# Patient Record
Sex: Female | Born: 1935 | Race: White | Hispanic: No | Smoking: Never smoker
Health system: Southern US, Community
[De-identification: ages and names within clinical notes are randomized; demographics above are authoritative.]

## PROBLEM LIST (undated history)

## (undated) DIAGNOSIS — K219 Gastro-esophageal reflux disease without esophagitis: Secondary | ICD-10-CM

## (undated) DIAGNOSIS — D72829 Elevated white blood cell count, unspecified: Secondary | ICD-10-CM

## (undated) DIAGNOSIS — I4891 Unspecified atrial fibrillation: Secondary | ICD-10-CM

## (undated) DIAGNOSIS — M48062 Spinal stenosis, lumbar region with neurogenic claudication: Secondary | ICD-10-CM

## (undated) DIAGNOSIS — I10 Essential (primary) hypertension: Secondary | ICD-10-CM

## (undated) DIAGNOSIS — E785 Hyperlipidemia, unspecified: Secondary | ICD-10-CM

## (undated) DIAGNOSIS — M810 Age-related osteoporosis without current pathological fracture: Secondary | ICD-10-CM

## (undated) DIAGNOSIS — D751 Secondary polycythemia: Secondary | ICD-10-CM

## (undated) DIAGNOSIS — I2510 Atherosclerotic heart disease of native coronary artery without angina pectoris: Secondary | ICD-10-CM

## (undated) HISTORY — DX: Secondary polycythemia: D75.1

## (undated) HISTORY — DX: Gastro-esophageal reflux disease without esophagitis: K21.9

## (undated) HISTORY — DX: Essential (primary) hypertension: I10

## (undated) HISTORY — DX: Age-related osteoporosis without current pathological fracture: M81.0

## (undated) HISTORY — DX: Spinal stenosis, lumbar region with neurogenic claudication: M48.062

## (undated) HISTORY — DX: Atherosclerotic heart disease of native coronary artery without angina pectoris: I25.10

## (undated) HISTORY — DX: Elevated white blood cell count, unspecified: D72.829

## (undated) HISTORY — DX: Unspecified atrial fibrillation: I48.91

## (undated) HISTORY — DX: Hyperlipidemia, unspecified: E78.5

---

## 2005-05-27 ENCOUNTER — Emergency Department: Payer: Self-pay | Admitting: Unknown Physician Specialty

## 2009-04-15 ENCOUNTER — Ambulatory Visit: Payer: Self-pay | Admitting: Family Medicine

## 2009-04-19 ENCOUNTER — Emergency Department: Payer: Self-pay | Admitting: Emergency Medicine

## 2010-02-20 ENCOUNTER — Ambulatory Visit: Payer: Self-pay | Admitting: Pain Medicine

## 2010-03-10 ENCOUNTER — Ambulatory Visit: Payer: Self-pay | Admitting: Pain Medicine

## 2010-03-13 ENCOUNTER — Ambulatory Visit: Payer: Self-pay | Admitting: Pain Medicine

## 2010-03-25 ENCOUNTER — Ambulatory Visit: Payer: Self-pay | Admitting: Pain Medicine

## 2010-04-14 ENCOUNTER — Ambulatory Visit: Payer: Self-pay | Admitting: Pain Medicine

## 2010-07-17 ENCOUNTER — Ambulatory Visit: Payer: Self-pay | Admitting: Pain Medicine

## 2010-08-04 ENCOUNTER — Ambulatory Visit: Payer: Self-pay | Admitting: Pain Medicine

## 2010-09-04 ENCOUNTER — Ambulatory Visit: Payer: Self-pay | Admitting: Pain Medicine

## 2010-09-11 ENCOUNTER — Ambulatory Visit: Payer: Self-pay | Admitting: Pain Medicine

## 2010-09-17 ENCOUNTER — Ambulatory Visit: Payer: Self-pay | Admitting: Pain Medicine

## 2010-11-27 ENCOUNTER — Ambulatory Visit: Payer: Self-pay | Admitting: Pain Medicine

## 2010-12-17 ENCOUNTER — Ambulatory Visit: Payer: Self-pay | Admitting: Pain Medicine

## 2010-12-31 ENCOUNTER — Ambulatory Visit: Payer: Self-pay | Admitting: Pain Medicine

## 2011-01-14 ENCOUNTER — Ambulatory Visit: Payer: Self-pay | Admitting: Pain Medicine

## 2011-04-03 ENCOUNTER — Ambulatory Visit: Payer: Self-pay | Admitting: Internal Medicine

## 2011-04-27 ENCOUNTER — Other Ambulatory Visit: Payer: Self-pay

## 2011-04-27 LAB — BASIC METABOLIC PANEL
BUN: 10 mg/dL (ref 7–18)
Co2: 25 mmol/L (ref 21–32)
Glucose: 129 mg/dL — ABNORMAL HIGH (ref 65–99)
Osmolality: 291 (ref 275–301)
Potassium: 3.5 mmol/L (ref 3.5–5.1)
Sodium: 146 mmol/L — ABNORMAL HIGH (ref 136–145)

## 2011-04-28 ENCOUNTER — Ambulatory Visit: Payer: Self-pay | Admitting: Internal Medicine

## 2011-08-05 ENCOUNTER — Ambulatory Visit: Payer: Self-pay | Admitting: Pain Medicine

## 2011-08-17 ENCOUNTER — Ambulatory Visit: Payer: Self-pay | Admitting: Internal Medicine

## 2011-08-17 LAB — PROTIME-INR: Prothrombin Time: 17.6 secs — ABNORMAL HIGH (ref 11.5–14.7)

## 2011-09-17 ENCOUNTER — Ambulatory Visit: Payer: Self-pay | Admitting: Pain Medicine

## 2011-10-06 ENCOUNTER — Ambulatory Visit: Payer: Self-pay | Admitting: Pain Medicine

## 2011-10-15 ENCOUNTER — Ambulatory Visit: Payer: Self-pay | Admitting: Pain Medicine

## 2011-11-11 ENCOUNTER — Ambulatory Visit: Payer: Self-pay | Admitting: Pain Medicine

## 2011-12-02 ENCOUNTER — Ambulatory Visit: Payer: Self-pay | Admitting: Pain Medicine

## 2011-12-09 ENCOUNTER — Ambulatory Visit: Payer: Self-pay | Admitting: Pain Medicine

## 2011-12-16 ENCOUNTER — Ambulatory Visit: Payer: Self-pay | Admitting: Pain Medicine

## 2011-12-17 ENCOUNTER — Inpatient Hospital Stay: Payer: Self-pay | Admitting: Student

## 2011-12-17 LAB — COMPREHENSIVE METABOLIC PANEL
Albumin: 3.5 g/dL (ref 3.4–5.0)
Alkaline Phosphatase: 91 U/L (ref 50–136)
BUN: 9 mg/dL (ref 7–18)
Bilirubin,Total: 0.7 mg/dL (ref 0.2–1.0)
Chloride: 109 mmol/L — ABNORMAL HIGH (ref 98–107)
Creatinine: 0.82 mg/dL (ref 0.60–1.30)
EGFR (African American): >60
EGFR (Non-African Amer.): >60
Glucose: 103 mg/dL — ABNORMAL HIGH (ref 65–99)
Osmolality: 284 (ref 275–301)
Potassium: 3.9 mmol/L (ref 3.5–5.1)
SGOT(AST): 16 U/L (ref 15–37)
SGPT (ALT): 16 U/L (ref 12–78)
Sodium: 143 mmol/L (ref 136–145)
Total Protein: 7 g/dL (ref 6.4–8.2)

## 2011-12-17 LAB — TROPONIN I: Troponin-I: <0.02 ng/mL

## 2011-12-17 LAB — CBC
HCT: 41 % (ref 35.0–47.0)
MCHC: 31.8 g/dL — ABNORMAL LOW (ref 32.0–36.0)
Platelet: 341 10*3/uL (ref 150–440)
RDW: 18.1 % — ABNORMAL HIGH (ref 11.5–14.5)
WBC: 10.5 10*3/uL (ref 3.6–11.0)

## 2011-12-17 LAB — URINALYSIS, COMPLETE
Bacteria: NONE SEEN
Bilirubin,UR: NEGATIVE
Blood: NEGATIVE
Glucose,UR: NEGATIVE mg/dL (ref 0–75)
Nitrite: NEGATIVE
Protein: NEGATIVE
RBC,UR: 1 /HPF (ref 0–5)
Squamous Epithelial: 3
WBC UR: 1 /HPF (ref 0–5)

## 2011-12-17 LAB — TSH: Thyroid Stimulating Horm: 3.01 u[IU]/mL

## 2011-12-17 LAB — PROTIME-INR
INR: 2.8
Prothrombin Time: 29.6 secs — ABNORMAL HIGH (ref 11.5–14.7)

## 2011-12-17 LAB — CK TOTAL AND CKMB (NOT AT ARMC)
CK, Total: 63 U/L (ref 21–215)
CK-MB: 1.5 ng/mL (ref 0.5–3.6)

## 2011-12-18 LAB — CBC WITH DIFFERENTIAL/PLATELET
Basophil %: 0.2 %
Eosinophil #: 0.3 10*3/uL (ref 0.0–0.7)
HCT: 37.7 % (ref 35.0–47.0)
HGB: 12.3 g/dL (ref 12.0–16.0)
Lymphocyte #: 1.2 10*3/uL (ref 1.0–3.6)
Lymphocyte %: 12.8 %
MCH: 27.9 pg (ref 26.0–34.0)
MCHC: 32.7 g/dL (ref 32.0–36.0)
MCV: 86 fL (ref 80–100)
Monocyte #: 0.7 x10 3/mm (ref 0.2–0.9)
Monocyte %: 7.7 %
Neutrophil #: 7.3 10*3/uL — ABNORMAL HIGH (ref 1.4–6.5)
RDW: 17.5 % — ABNORMAL HIGH (ref 11.5–14.5)
WBC: 9.6 10*3/uL (ref 3.6–11.0)

## 2011-12-18 LAB — BASIC METABOLIC PANEL
BUN: 7 mg/dL (ref 7–18)
Calcium, Total: 8.5 mg/dL (ref 8.5–10.1)
Creatinine: 0.71 mg/dL (ref 0.60–1.30)
EGFR (African American): >60
EGFR (Non-African Amer.): >60
Glucose: 101 mg/dL — ABNORMAL HIGH (ref 65–99)
Potassium: 3.4 mmol/L — ABNORMAL LOW (ref 3.5–5.1)
Sodium: 143 mmol/L (ref 136–145)

## 2011-12-18 LAB — PROTIME-INR
INR: 3.7
Prothrombin Time: 36.7 secs — ABNORMAL HIGH (ref 11.5–14.7)

## 2011-12-18 LAB — MAGNESIUM: Magnesium: 1.5 mg/dL — ABNORMAL LOW

## 2011-12-19 LAB — POTASSIUM: Potassium: 3.6 mmol/L (ref 3.5–5.1)

## 2011-12-19 LAB — MAGNESIUM: Magnesium: 2.2 mg/dL

## 2011-12-19 LAB — PROTIME-INR: Prothrombin Time: 39.7 secs — ABNORMAL HIGH (ref 11.5–14.7)

## 2011-12-20 LAB — PROTIME-INR
INR: 3.5
Prothrombin Time: 35.1 secs — ABNORMAL HIGH (ref 11.5–14.7)

## 2011-12-22 LAB — CULTURE, BLOOD (SINGLE)

## 2012-02-03 DIAGNOSIS — I2510 Atherosclerotic heart disease of native coronary artery without angina pectoris: Secondary | ICD-10-CM | POA: Insufficient documentation

## 2013-09-13 DIAGNOSIS — R0602 Shortness of breath: Secondary | ICD-10-CM | POA: Insufficient documentation

## 2013-10-02 DIAGNOSIS — M48061 Spinal stenosis, lumbar region without neurogenic claudication: Secondary | ICD-10-CM | POA: Insufficient documentation

## 2013-10-02 DIAGNOSIS — M47816 Spondylosis without myelopathy or radiculopathy, lumbar region: Secondary | ICD-10-CM | POA: Insufficient documentation

## 2014-01-25 DIAGNOSIS — I482 Chronic atrial fibrillation, unspecified: Secondary | ICD-10-CM | POA: Insufficient documentation

## 2014-01-25 DIAGNOSIS — E782 Mixed hyperlipidemia: Secondary | ICD-10-CM | POA: Insufficient documentation

## 2014-05-23 DIAGNOSIS — I340 Nonrheumatic mitral (valve) insufficiency: Secondary | ICD-10-CM | POA: Insufficient documentation

## 2014-07-24 NOTE — Discharge Summary (Signed)
PATIENT NAME:  Dominique French, Dominique French MR#:  [id number] DATE OF BIRTH:  1926/06/26  DATE OF ADMISSION:  12/17/2011 DATE OF DISCHARGE:  12/20/2011  CONSULTANTS:  1. Dr. Juliann PulseLundquist  2. Physical therapist    PRIMARY CARE PHYSICIAN: Dr. Beckey DowningWillett    CHIEF COMPLAINT: Nausea, weakness.   DISCHARGE DIAGNOSES:  1. Nausea possibly from constipation and fentanyl patch.  2. Atrial fibrillation with rapid ventricular response.  3. Coagulopathy from elevated INR in the setting of Coumadin use.   SECONDARY DIAGNOSES:  1. Deconditioning.  2. Malignant hypertension.  3. Chronic back pain.  4. Possible restless leg syndrome.   DISCHARGE MEDICATIONS:  1. Metoprolol 50 mg 2 times a day.  2. Diltiazem extended-release 180 mg 1 tab 2 times a day.  3. Warfarin 5 mg 1 tab daily. Please resume this once you have checked your INR and you have been instructed by your primary care physician to resume Coumadin.  4. Zofran 4 mg 1 tab every eight hours as needed for nausea.  5. Dulcolax 50 mg/8.6 mg oral tab 2 tabs once a day at bedtime.   DIET: Low sodium.   ACTIVITY: As tolerated.   FOLLOW-UP: Please follow-up with her primary care physician within 1 to 2 days and check your INR on Tuesday.   DISPOSITION: Home with home health.  CODE STATUS: The patient is FULL CODE.   HISTORY OF PRESENT ILLNESS: For full details of the history and physical, please see the dictation on 12/17/2011 by Dr. Luberta MutterKonidena. Briefly, this is an 81 year old female with chronic pain who recently had her fentanyl patch increased from 12 mcg to 50 mcg for increased pain who presented with nausea and weakness. The patient was noted to have atrial fibrillation with RVR on arrival with rates of up to 130's to 140's and received some IV diltiazem and admitted to the hospitalist service for further evaluation and management.   SIGNIFICANT LABS AND IMAGING: Creatinine on arrival 0.82, sodium 143, potassium 3.9. Lowest magnesium was 1.2. LFTs within  normal limits. Troponin negative. TSH 3.01. WBC 10.5, hemoglobin 13.1, hematocrit 41, platelets 341. Initial INR 2.8. Peak INR 4.1. Last INR 3.5. Blood cultures no growth to date x2 from September 12th. Urinalysis not suggestive of infection.   CT head without contrast showing no acute intracranial process.   CT chest for PE with contrast showing no pulmonary embolus. Indeterminate 7 mm nodule in the right middle lobe which is indeterminate. Gallbladder is distended but incompletely visualized.   X-ray of the chest, one view, on arrival showing mild right perihilar infiltrate.   Abdomen complete three or more view showing unremarkable abdominal radiograph.   Ultrasound of abdomen showing a distended gallbladder 9.5 x 5.9 x 6.2 cm with stones and sludge. No gallbladder wall thickening or pericholecystic fluid or positive sonographic Murphy's sign. Intrahepatic biliary tree and common bile duct do not appear dilated.   HIDA scan negative.   HOSPITAL COURSE: The patient was admitted to the hospitalist service.  1. In regards to atrial fibrillation with RVR, she did receive some IV diltiazem on arrival and her beta-blocker and calcium channel blocker were resumed with good effect. Her initial INR was in the 2's, however, did go up to 4.1 on 09/14. Coumadin has been held. She can resume this once she checks her INR and she was instructed to check her INR on Tuesday and resume Coumadin after instructed to do so by her primary care physician when INR is between 2 and 3. She had  no palpitations or chest pains.  2. Her nausea was found to be likely secondary to fentanyl patch and constipation. The patient recently had the fentanyl patch increased from 12 mcg to 50 mcg patch and her symptoms coincided with that increase in the dosage. She underwent ultrasound, CAT scan, and HIDA scan. She was also seen by Surgery. No surgical intervention was recommended and, per Surgery, the nausea was likely secondary to  constipation. The patient was started on a bowel regimen, given suppositories and enema without effect and did have a bowel movement x3 with magnesium citrate with improvement in her nausea.  3. The patient initially was also thought to have pneumonia on the right side, however, the CAT scan of the chest is negative for pneumonia and the Levaquin which was started was discontinued.   DISPOSITION: At this point she will be going home with home health with PT.    ____________________________ Krystal Eaton, MD sa:drc D: 12/20/2011 14:42:57 ET T: 12/21/2011 09:26:27 ET JOB#: [id number]  cc: Krystal Eaton, MD, <Dictator> Jorje Guild. Beckey Downing, MD Krystal Eaton MD ELECTRONICALLY SIGNED 12/25/2011 14:22

## 2014-07-24 NOTE — H&P (Signed)
PATIENT NAME:  Dominique French, Dominique French MR#:  [id number] DATE OF BIRTH:  Aug 22, 1926  DATE OF ADMISSION:  12/17/2011  PRIMARY CARE PHYSICIAN: Sherrine Maples R. Beckey Downing, MD   REFERRING PHYSICIAN:   Malachy Moan, MD   CHIEF COMPLAINT: Nausea, weakness.    HISTORY OF PRESENT ILLNESS: The patient is an 81 year old female with history of hypertension, chronic atrial fibrillation, also has history of chronic back pain, was taking fentanyl patches. She was on Duragesic patch 12 mcg for the last few months, but the dose was increased to 50 mcg a week ago by Dr. Laban Emperor. The patient took the dose for about one week, and after that she stopped; and she felt very nauseous with decreased appetite, and she could not take it anymore and she came to ER today. The patient complains of generalized weakness with nausea and decreased appetite since she started to take those high-dose Duragesic patches 50 mcg. In the ER, she was evaluated and found to have pneumonia, atrial fibrillation with rapid ventricular response and uncontrolled hypertension. The patient denies chest pain, no cough, no fever, no  trouble breathing. The patient had no diarrhea. In the Emergency Room, she had atrial fibrillation with rapid ventricular response up to a rate of 130 to 140 beats. She had Cardizem 20 IV push.   PAST MEDICAL HISTORY: Significant for: 1. Chronic atrial fibrillation on Coumadin.  2. Had history of cardioversions before with Dr. Gwen Pounds.  3. History of chronic pain. The patient sees the [facility], and she has got epidural injections and she has been on fentanyl patch for the last three months, which is 12 mcg, and she was just adjusted to increased to 50 mcg a week ago.   ALLERGIES: Tramadol and Xarelto.    SOCIAL HISTORY: No smoking, no drinking, no drugs.   PAST SURGICAL HISTORY: Significant for back surgery.   SOCIAL HISTORY: No smoking, no drinking, no drugs. She lives alone.  FAMILY HISTORY: Significant for hypertension in  the family.   MEDICATIONS:  1. Diltiazem 180 mg p.o. b.i.d.  2. Metoprolol 50 mg p.o. b.i.d.  3. Coumadin 5 mg daily.    REVIEW OF SYSTEMS: CONSTITUTIONAL: Feels fatigue and weakness. EYES: No blurred vision. ENT: No tinnitus. No epistaxis. No difficulty swallowing. RESPIRATORY: No cough. No wheezing. CARDIOVASCULAR: No chest pain. No orthopnea. No PND. GI: Has nausea and poor appetite, but no abdominal pain or diarrhea. GENITOURINARY: No dysuria. ENDOCRINE: No polyuria or nocturia. HEMATOLOGIC: No anemia. INTEGUMENTARY: No skin rashes. MUSCULOSKELETAL: The patient has back pain and is taking Duragesic patch for that. NEUROLOGIC: No numbness or weakness in the legs. No dysarthria. No CVA or transient ischemic attacks. PSYCHIATRIC: No anxiety or insomnia.   PHYSICAL EXAMINATION:  VITAL SIGNS: Temperature 97.7, pulse was 100, respirations 18, blood pressure initially 140/96,    HEENT: Head: Atraumatic, normocephalic. Pupils are equal, round and reactive to light and accommodation. Extraocular movements are intact. ENT: No tympanic membrane congestion. No turbinate hypertrophy. No oropharyngeal erythema.   NECK: Normal range of motion. No JVD. No carotid bruit.   CARDIOVASCULAR: S1, S2, irregularly irregular rate, and PMI not displaced.   LUNGS: Clear to auscultation. No wheeze. No rales.   ABDOMEN: Soft, nontender, nondistended. Bowel sounds are present.   EXTREMITIES: Strength is five out of five in upper and lower extremities.   SKIN: No skin rashes.   LYMPH NODES: No lymphadenopathy in cervical region.   NEUROLOGIC: Cranial nerves II through XII are intact. Power 5/5 in upper and lower  extremities. Sensation intact.   PSYCHIATRIC: Oriented to time, place, person.   LABORATORY, DIAGNOSTIC AND RADIOLOGICAL DATA: Abdominal ultrasound is done because the patient complained of nausea. It showed gallbladder sludge and stones. No gallbladder wall thickening or pericholecystic fluid. No  positive sonographic Murphy's sign. Intrahepatic biliary tree and common bile duct are not dilated. Liver is normal. Pancreas is obscured by bowel gas. Clear urine with  trace leukocyte esterase and bacteria none. A CT of the head showed no acute intracranial process. Chest x-ray showed mild liters right perihilar infiltrate. Follow-up is recommended. The patient's CK total is 63, CPK-MB 1.5. Electrolytes: Sodium 143, potassium 3.9, chloride 109, bicarbonate 25, BUN 9, creatinine 0.82, glucose 103, liver functions within normal limits. The patient's INR is 2.8. Troponin less than 0.02. TSH 3.01. EKG showed atrial fibrillation, 79 beats per minute initially when she came.   ASSESSMENT AND PLAN: The patient is an 81 year old female patient with:  1. Nausea and decreased p.o. intake for the past one week: The patient probably got intolerant to high doses of Duragesic patch, and now she is off the patch since one week. She is feeling better, but we are going to admit her for atrial fibrillation with rapid ventricular response and uncontrolled hypertension probably secondary to weakness with nausea and dizziness. I did give her normal saline and also continued on telemetry. The patient has gallbladder stones with sludge and has no abdominal pain. We will see if it is causing her any problems, start her on diet, and she probably can benefit from surgical consult if she does not get better.  2. Atrial fibrillation with RVR and uncontrolled hypertension: The patient will be on Cardizem, metoprolol and continue those; and if not controlled we will go up on metoprolol dose or put her on Cardizem drip and consult Cardiology if her rate is uncontrolled.  3. Chronic atrial fibrillation on Coumadin: INR is therapeutic. Continue the Coumadin.  4. Chronic back pain: She denies any pain at this time, so we will hold off on the pain medications at this time and continue ambulation out of bed as tolerated.  5. The patient  already got blood cultures for pneumonia. She does have some right hilar infiltrate, so continue Levaquin for that and repeat chest x-ray after hydration probably tomorrow.   I discussed the plan with the daughter.     TIME SPENT:   About  60 minutes.   ____________________________ Katha HammingSnehalatha Sharryn Belding, MD sk:cbb D: 12/17/2011 17:07:34 ET T: 12/17/2011 17:55:15 ET JOB#: [id number]  cc: Katha HammingSnehalatha Tarun Patchell, MD, <Dictator> Jorje GuildGlenn R. Beckey DowningWillett, MD Katha HammingSNEHALATHA Briahna Pescador MD ELECTRONICALLY SIGNED 01/06/2012 22:33

## 2014-07-24 NOTE — Consult Note (Signed)
PATIENT NAME:  Dominique French, Dominique French MR#:  [id number] DATE OF BIRTH:  [birth year]  DATE OF CONSULTATION:  12/18/2011  REFERRING PHYSICIAN:   CONSULTING PHYSICIAN:  Cristal Deer A. Lucky Alverson, MD  INDICATION FOR CONSULTATION: Nausea and poor p.o. intake x1 week.  HISTORY OF PRESENT ILLNESS: Dominique French is a pleasant 81 year old female who has a history of atrial fibrillation and chronic back pain who recently approximately three weeks ago doubled the narcotic dose on her analgesic patch. Over the last week she said that she has had increasing nausea and inability to tolerate p.o. However, she is able to keep down water. She says that she is just too nauseated to eat. Nothing makes it better. She is unsure whether or not she has been given nausea medication here and is unsure whether or not it makes it better. She has stopped her patch approximately a week ago and she said her last bowel movement was two days ago. It was normal consistency but prior to that had not had a bowel movement for more than 1 week. No attempts have been made to make her have more frequent bowel movements. She has no abdominal pain whatsoever. No fevers, chills, night sweats, shortness of breath, chest pain, cough, dysuria, or hematuria.   PAST MEDICAL HISTORY:  1. Hypertension.  2. Atrial fibrillation, on Coumadin.  3. Chronic back pain.  HOME MEDICATIONS: Diltiazem, metoprolol, Coumadin, and until recently Duragesic patch.   DRUG ALLERGIES: Tramadol and Xarelto.  SOCIAL HISTORY: She lives alone in [place]. No tobacco, alcohol or other use.  Has 10 daughters.  FAMILY HISTORY: Potentially vague history of hypertension, although she said that her father was killed in War World II and her mother was alive until 24 but was relatively healthy prior.   REVIEW OF SYSTEMS: A total 10-point review of systems was conducted and pertinent positives and negatives are above.   PHYSICAL EXAMINATION:  VITAL SIGNS: Temperature 98, pulse  120, blood pressure 119/78, respirations 18, and 97% on room air.   GENERAL: No acute distress. Alert and oriented x3.   HEAD: Normocephalic, atraumatic.   EYES: No jaundice. No conjunctivitis.   FACE: Normal external nose. Normal external ears.   NECK: Supple, no obvious masses.   CHEST: Lungs clear to auscultation, moving air well.   ABDOMEN: Soft, nontender, and completely nondistended. No obvious surgical scars.   EXTREMITIES: Moves all extremities well. Strength five out of five all 4 extremities  LABORATORY AND RADIOLOGIC DATA: White cell count 9.6, hemoglobin and hematocrit 12.3 and 37.7, platelets 283, and neutrophils 76%.   INR is 3.7. Renal panel is significant for potassium of 3.4. Magnesium is 1.5.   Ultrasound shows distended gallbladder, stones and sludge. No thickening or pericholecystic fluid. No Murphy sign. Normal bile duct diameters.   CT of head shows no acute processes.   Chest x-ray: The lungs do have a right hilar infiltrate.   ASSESSMENT AND PLAN: Dominique French is an 81 year old female with one week of nausea and poor p.o. I think it likely that she could be constipated as per history of significant narcotic use and has not had a bowel movement (prior to two days ago) for more than a week. Would recommend plain film to evaluate stool burdon.  I would also recommend starting a significant bowel regimen so she has normal bowel movements to see if her discomfort improves. He malaise could also be related to a pneumonia. She is being treated for that. Nausea secondary to gallbladder pathology is low  on the differential, although it is distended and has stones. She is having no abdominal pain whatsoever and with palpation and usually people have nausea with gallbladder problems have at least some pain. Labs are relatively unremarkable. If the patient was found to have need for cholecystectomy, we would would have to either actively reverse her INR or at leastlet it drift  down, although I would not put her through the risks of FFP transfusion as she is not systemically ill. HIDA I believe is of low yield. We will continue to follow with you. ____________________________ Si Raiderhristopher A. Darrly Loberg, MD cal:slb D: 12/18/2011 10:20:44 ET T: 12/18/2011 11:21:54 ET JOB#: [id number]  cc: Cristal Deerhristopher A. Hillary Schwegler, MD, <Dictator> Jarvis NewcomerHRISTOPHER A Thekla Colborn MD ELECTRONICALLY SIGNED 12/18/2011 16:42

## 2014-09-14 DIAGNOSIS — I10 Essential (primary) hypertension: Secondary | ICD-10-CM | POA: Insufficient documentation

## 2014-10-18 ENCOUNTER — Inpatient Hospital Stay: Payer: Medicare Other | Attending: Internal Medicine | Admitting: Internal Medicine

## 2014-10-18 ENCOUNTER — Encounter: Payer: Self-pay | Admitting: Internal Medicine

## 2014-10-18 ENCOUNTER — Inpatient Hospital Stay: Payer: Medicare Other

## 2014-10-18 VITALS — BP 138/79 | HR 77 | Temp 98.4°F | Resp 18 | Ht 61.0 in | Wt 185.2 lb

## 2014-10-18 DIAGNOSIS — D751 Secondary polycythemia: Secondary | ICD-10-CM | POA: Insufficient documentation

## 2014-10-18 DIAGNOSIS — D72829 Elevated white blood cell count, unspecified: Secondary | ICD-10-CM

## 2014-10-18 DIAGNOSIS — Z79899 Other long term (current) drug therapy: Secondary | ICD-10-CM | POA: Insufficient documentation

## 2014-10-18 DIAGNOSIS — D72821 Monocytosis (symptomatic): Secondary | ICD-10-CM | POA: Diagnosis not present

## 2014-10-18 LAB — CBC WITH DIFFERENTIAL/PLATELET
Basophils Absolute: 0.1 10*3/uL (ref 0–0.1)
Basophils Relative: 1 %
EOS PCT: 4 %
Eosinophils Absolute: 0.5 10*3/uL (ref 0–0.7)
HEMATOCRIT: 45.6 % (ref 35.0–47.0)
Hemoglobin: 15 g/dL (ref 12.0–16.0)
LYMPHS PCT: 19 %
Lymphs Abs: 2.7 10*3/uL (ref 1.0–3.6)
MCH: 30.2 pg (ref 26.0–34.0)
MCHC: 32.8 g/dL (ref 32.0–36.0)
MCV: 92.1 fL (ref 80.0–100.0)
Monocytes Absolute: 1 10*3/uL — ABNORMAL HIGH (ref 0.2–0.9)
Monocytes Relative: 7 %
NEUTROS ABS: 10.4 10*3/uL — AB (ref 1.4–6.5)
NEUTROS PCT: 71 %
Platelets: 334 10*3/uL (ref 150–440)
RBC: 4.95 MIL/uL (ref 3.80–5.20)
RDW: 14.7 % — AB (ref 11.5–14.5)
WBC: 14.7 10*3/uL — ABNORMAL HIGH (ref 3.6–11.0)

## 2014-10-18 LAB — LACTATE DEHYDROGENASE: LDH: 144 U/L (ref 98–192)

## 2014-10-18 NOTE — Progress Notes (Signed)
No fevers, no infections, pt has left shoulder pain when using.

## 2014-10-19 LAB — MISC LABCORP TEST (SEND OUT): Labcorp test code: 7187

## 2014-10-22 NOTE — Progress Notes (Signed)
[facility]  Telephone:([phone]) [phone] Fax:([phone]) [phone]     ID: Dominique French OB: Jun 18, 1926  MR#: [id number]  ZHY#:[id number]  No care team member to display  CHIEF COMPLAINT/DIAGNOSIS:  Persistent leukocytosis, also had erythrocytosis in December 2015. (09/28/2014 elevated WBC count of 12,500 with 69% neutrophils (ANC 8600), 21% lymphocytes (ALC 2700), mild relative monocytosis of 1200, hemoglobin 14.9, platelets 224. Prior to this WBC was 13.6 in December 2015, 9.9 in October 2011, 9.1 in July 2011. In Dec 2015 hemoglobin was above normal range at 15.6 and hematocrit was elevated at 48%).  -  patient referred here for hematology evaluation.  HISTORY OF PRESENT ILLNESS: Dominique French is a 81 year old female patient with past medical history as detailed below. Patient was recently seen by primary physician and had labs drawn on 09/28/2014 which showed elevated WBC count of 12,500 with 69% neutrophils (ANC 8600), 21% lymphocytes (ALC 2700), mild relative monocytosis of 1200, hemoglobin 14.9, platelets 224. Prior to this WBC was 13.6 in December 2015, 9.9 in October 2011, 9.1 in July 2011. Interestingly, in December 2015 hemoglobin was above normal range at 15.6 and hematocrit was elevated at 48%. Patient denies history of smoking for secondhand smoke exposure. Clinically he states that she has chronic fatigue on exertion but otherwise denies any fevers, night sweats. No chills or recurrent infections. Denies any ongoing symptoms of sinusitis, sore throat, cough, sputum, hemoptysis or chest pain. No abdominal pain, diarrhea, dysuria or hematuria. No new skin rashes. No new joint redness or swelling. Denies history of thromboembolic phenomenon in the past. Appetite is good, no unintentional weight loss   REVIEW OF SYSTEMS:   ROS CONSTITUTIONAL: As in HPI above. No chills, fever or sweats.    ENT:  No headache, dizziness or epistaxis. No ear or jaw pain. No sinus  symptoms. RESPIRATORY:   No cough.  No shortness of breath. No wheezing. No hemoptysis. CARDIAC:  No palpitations.  No retrosternal chest pain. No orthopnea, PND. GI:  No abdominal pain, nausea or vomiting. No diarrhea.   GU:  No dysuria or hematuria.  SKIN: No rashes or pruritus. HEMATOLOGIC: denies bleeding symptoms MUSCULOSKELETAL:  No new bone pains.  EXTREMITY:  No new swelling or pain.  NEURO:  No focal weakness. No numbness or tingling of extremities.  No seizures.   ENDOCRINE:  No polyuria or polydipsia.   PAST MEDICAL HISTORY: Reviewed. Past Medical History  Diagnosis Date  . A-fib   . Lumbar stenosis with neurogenic claudication   . GERD (gastroesophageal reflux disease)   . Osteoporosis   . Hyperlipemia   . CAD (coronary artery disease)     PAST SURGICAL HISTORY: Reviewed. History reviewed. No pertinent past surgical history.  FAMILY HISTORY: Reviewed. History reviewed. No pertinent family history.  SOCIAL HISTORY: Reviewed. History  Substance Use Topics  . Smoking status: Never Smoker   . Smokeless tobacco: Never Used  . Alcohol Use: No    Allergies  Allergen Reactions  . Rivaroxaban Itching    Current Outpatient Prescriptions  Medication Sig Dispense Refill  . apixaban (ELIQUIS) 5 MG TABS tablet Take 5 mg by mouth 2 (two) times daily.    . digoxin (LANOXIN) 0.125 MG tablet Take 0.125 mg by mouth daily.    Marland Kitchen diltiazem (DILACOR XR) 240 MG 24 hr capsule Take 240 mg by mouth 2 (two) times daily.    . metoprolol succinate (TOPROL-XL) 50 MG 24 hr tablet Take 50 mg by mouth 3 (three) times daily. Take with  or immediately following a meal.     No current facility-administered medications for this visit.    PHYSICAL EXAM: Filed Vitals:   10/18/14 0908  BP: 138/79  Pulse: 77  Temp: 98.4 F (36.9 C)  Resp: 18     Body mass index is 35.01 kg/(m^2).      GENERAL: Patient is alert and oriented and in no acute distress. There is no icterus. Mild  pallor. HEENT: EOMs intact. Oral exam negative for thrush or lesions. No cervical lymphadenopathy. CVS: S1S2, regular LUNGS: Bilaterally clear to auscultation, no rhonchi. ABDOMEN: Soft, nontender. No hepatosplenomegaly clinically.  NEURO: grossly nonfocal, cranial nerves are intact.   EXTREMITIES: No pedal edema. SKIN: No major bruising or rash MUSCULOSKELETAL: No obvious joint redness or swelling LYMPHATICS: No palpable adenopathy in axillary or inguinal areas.   LAB RESULTS:    Component Value Date/Time   NA 143 12/18/2011 0405   K 3.6 12/19/2011 0530   CL 110* 12/18/2011 0405   CO2 25 12/18/2011 0405   GLUCOSE 101* 12/18/2011 0405   BUN 7 12/18/2011 0405   CREATININE 0.71 12/18/2011 0405   CALCIUM 8.5 12/18/2011 0405   PROT 7.0 12/17/2011 1129   ALBUMIN 3.5 12/17/2011 1129   AST 16 12/17/2011 1129   ALT 16 12/17/2011 1129   ALKPHOS 91 12/17/2011 1129   GFRNONAA >60 12/18/2011 0405   GFRAA >60 12/18/2011 0405   Lab Results  Component Value Date   WBC 14.7* 10/18/2014   NEUTROABS 10.4* 10/18/2014   HGB 15.0 10/18/2014   HCT 45.6 10/18/2014   MCV 92.1 10/18/2014   PLT 334 10/18/2014     STUDIES: No results found.   ASSESSMENT / PLAN:   Persistent leukocytosis, also had erythrocytosis in December 2015. (09/28/2014 elevated WBC count of 12,500 with 69% neutrophils (ANC 8600), 21% lymphocytes (ALC 2700), mild relative monocytosis of 1200, hemoglobin 14.9, platelets 224. Prior to this WBC was 13.6 in December 2015, 9.9 in October 2011, 9.1 in July 2011. In Dec 2015 hemoglobin was above normal range at 15.6 and hematocrit was elevated at 48%).  -  patient referred here for hematology evaluation. I have reviewed records sent but I think physician and also recent labs and previous labs are discussed with patient in detail. Have explained that there is no obvious etiology for persistent leukocytosis (no ongoing fevers/chills/symptoms of infection. She is a nonsmoker. No  history of recent steroid therapy or any acute illness). She also has had intermittent erythrocytosis of unclear etiology. She needs further workup to evaluate for myeloproliferative disorder or other hematological disorder causing leukocytosis. We will send labs for CBC with differential, LDH, leukocyte alkaline phosphatase score, BCL-ABL study to rule out chronic myeloid leukemia, ANA, serum erythropoietin, peripheral blood flow cytometry to look for monoclonal B-cell population/lymphoproliferative disorder. Will also send for [id number] mutation and Exon 12 analysis to look for evidence of polycythemia vera. Patient otherwise seems to be mostly asymptomatic from these hematological abnormalities and no immediate treatment is needed. Will see her back in 3 weeks and make further plan of management based upon results of this workup. In between visits, the patient has been advised to call or come to the ER in case of fevers, chills, bleeding, acute sickness or new symptoms. Patient agreeable to this plan.    Janese BanksSandeep Sherilynn Dieu, MD   10/22/2014 9:14 PM

## 2014-10-24 ENCOUNTER — Telehealth: Payer: Self-pay | Admitting: *Deleted

## 2014-10-24 NOTE — Telephone Encounter (Signed)
Called daughter Ms. Baker at [phone] and explained to her that the level was slightly elevated and since she is not a smoker the next thing to check is carbon monoxide in her house.  They had fire dept come out yest and they rtned today and recheck and no abnl level at all.  Explained that this gas in everywhere in the environment and since she has had her house checked that is the what we wanted to have check to be on the safe side.  I also called pt's house and the pt was asleep but her other daughter answered the phone and had already called pt's house and told them all what I had said.

## 2014-10-26 LAB — JAK2  V617F QUAL. WITH REFLEX TO EXON 12

## 2014-10-26 LAB — COMP PANEL: LEUKEMIA/LYMPHOMA

## 2014-10-26 LAB — ENA+DNA/DS+ANTICH+CENTRO+JO...
Centromere Ab Screen: <0.2 AI (ref 0.0–0.9)
RIBONUCLEIC PROTEIN: 4.1 AI — AB (ref 0.0–0.9)
SSA (Ro) (ENA) Antibody, IgG: <0.2 AI (ref 0.0–0.9)
SSB (La) (ENA) Antibody, IgG: <0.2 AI (ref 0.0–0.9)
ds DNA Ab: <1 IU/mL (ref 0–9)

## 2014-10-26 LAB — JAK2 EXON 12 MUTATION ANALYSIS

## 2014-10-26 LAB — ERYTHROPOIETIN: Erythropoietin: 22.4 m[IU]/mL — ABNORMAL HIGH (ref 2.6–18.5)

## 2014-10-26 LAB — ANA W/REFLEX IF POSITIVE: ANA: POSITIVE — AB

## 2014-10-27 LAB — BCR-ABL1, CML/ALL, PCR, QUANT

## 2014-10-30 LAB — LEUKOCYTE ALKALINE PHOSPHATASE: Leukocyte Alkaline  Phos Stain: 79 (ref 25–130)

## 2014-11-08 ENCOUNTER — Inpatient Hospital Stay: Payer: Medicare Other | Attending: Internal Medicine | Admitting: Internal Medicine

## 2014-11-08 VITALS — BP 146/82 | HR 87 | Temp 98.0°F | Wt 184.3 lb

## 2014-11-08 DIAGNOSIS — I4891 Unspecified atrial fibrillation: Secondary | ICD-10-CM | POA: Diagnosis not present

## 2014-11-08 DIAGNOSIS — D72829 Elevated white blood cell count, unspecified: Secondary | ICD-10-CM | POA: Diagnosis not present

## 2014-11-08 DIAGNOSIS — R768 Other specified abnormal immunological findings in serum: Secondary | ICD-10-CM

## 2014-11-08 DIAGNOSIS — Z79899 Other long term (current) drug therapy: Secondary | ICD-10-CM | POA: Insufficient documentation

## 2014-11-08 DIAGNOSIS — I2510 Atherosclerotic heart disease of native coronary artery without angina pectoris: Secondary | ICD-10-CM | POA: Diagnosis not present

## 2014-11-13 ENCOUNTER — Ambulatory Visit
Admission: RE | Admit: 2014-11-13 | Discharge: 2014-11-13 | Disposition: A | Discharge status: Home / Self Care | Payer: Medicare Other | Source: Ambulatory Visit | Attending: Internal Medicine | Admitting: Internal Medicine

## 2014-11-13 DIAGNOSIS — D72829 Elevated white blood cell count, unspecified: Secondary | ICD-10-CM | POA: Insufficient documentation

## 2014-11-13 DIAGNOSIS — K8020 Calculus of gallbladder without cholecystitis without obstruction: Secondary | ICD-10-CM | POA: Insufficient documentation

## 2014-11-13 DIAGNOSIS — R768 Other specified abnormal immunological findings in serum: Secondary | ICD-10-CM | POA: Diagnosis present

## 2014-11-13 DIAGNOSIS — K828 Other specified diseases of gallbladder: Secondary | ICD-10-CM | POA: Insufficient documentation

## 2014-11-13 DIAGNOSIS — N9489 Other specified conditions associated with female genital organs and menstrual cycle: Secondary | ICD-10-CM | POA: Insufficient documentation

## 2014-11-13 DIAGNOSIS — K760 Fatty (change of) liver, not elsewhere classified: Secondary | ICD-10-CM | POA: Insufficient documentation

## 2014-11-16 NOTE — Progress Notes (Signed)
[facility]  Telephone:(336) [phone] Fax:(336) [phone]     ID: Dominique French OB: 1926/10/22  MR#: [id number]  JYN#:[id number]  Patient Care Team: No Pcp Per Patient as PCP - General (General Practice)  CHIEF COMPLAINT/DIAGNOSIS:  Persistent leukocytosis, also had erythrocytosis in December 2015 - ? Reactive given positive ANA and RNP on workup, versus other etiology like myeloproliferative disorder. Also has mild elevation of serum erythropoietin and carboxyhemoglobin, nonsmoker.  10/18/14 - WBC 14.7, hemoglobin 15.0, platelets 334, neutrophils 71%, lymphocytes 19%, serum erythropoietin above normal range at 22, carboxyhemoglobin slightly elevated at 2.6%, ANA positive, RNP positive, LAP score normal at 79, [id number] mutation negative, JAK2 exon 12 mutation negative BCR-ABL study negative, peripheral blood flow cytometry unremarkable.  (Prior labs on 09/28/14 elevated WBC count of 12,500 with 69% neutrophils (ANC 8600), 21% lymphocytes (ALC 2700), mild relative monocytosis of 1200, hemoglobin 14.9, platelets 224. Prior to this WBC was 13.6 in December 2015, 9.9 in October 2011, 9.1 in July 2011. In Dec 2015 hemoglobin was above normal range at 15.6 and hematocrit was elevated at 48%).   HISTORY OF PRESENT ILLNESS: Patient returns for continued hematology follow-up, labs done on July 14 as described below. Overall states that she is doing fairly steady. She denies history of smoking or secondhand smoke exposure. Carboxyhemoglobin level was slightly elevated at 2.6%, states that she has ruled out any source of carbon monoxide exposure in her home, got a check by the fire department. She has chronic fatigue on exertion but otherwise denies any fevers, night sweats. No chills or recurrent infections. Denies any ongoing symptoms of sinusitis, sore throat, cough, sputum, hemoptysis or chest pain. No abdominal pain, diarrhea, dysuria or hematuria. No new skin rashes. No new joint redness  or swelling. Denies history of thromboembolic phenomenon in the past. Appetite is good.   REVIEW OF SYSTEMS:   ROS As in HPI above. In addition, no fever, chills or sweats. No new headaches or focal weakness.  No new mood disturbances. No earache or discharge. No sore throat or dysphagia. No abdominal pain, constipation, diarrhea, dysuria or hematuria. No new bone pain. No skin rash or bleeding symptoms. No new paresthesias in extremities.  PAST MEDICAL HISTORY: Reviewed. Past Medical History  Diagnosis Date  . A-fib   . Lumbar stenosis with neurogenic claudication   . GERD (gastroesophageal reflux disease)   . Osteoporosis   . Hyperlipemia   . CAD (coronary artery disease)     PAST SURGICAL HISTORY: Reviewed. As above  FAMILY HISTORY: Reviewed. No pertinent family history  SOCIAL HISTORY: Reviewed. Social History  Substance Use Topics  . Smoking status: Never Smoker   . Smokeless tobacco: Never Used  . Alcohol Use: No    Allergies  Allergen Reactions  . Rivaroxaban Itching    Current Outpatient Prescriptions  Medication Sig Dispense Refill  . apixaban (ELIQUIS) 5 MG TABS tablet Take 5 mg by mouth 2 (two) times daily.    . digoxin (LANOXIN) 0.125 MG tablet Take 0.125 mg by mouth daily.    Marland Kitchen diltiazem (DILACOR XR) 240 MG 24 hr capsule Take 240 mg by mouth 2 (two) times daily.    . metoprolol succinate (TOPROL-XL) 50 MG 24 hr tablet Take 50 mg by mouth 3 (three) times daily. Take with or immediately following a meal.     No current facility-administered medications for this visit.    PHYSICAL EXAM: Filed Vitals:   11/08/14 0931  BP: 146/82  Pulse: 87  Temp: 98  F (36.7 C)     Body mass index is 34.84 kg/(m^2).      GENERAL: Patient is alert and oriented and in no acute distress. There is no icterus. HEENT: Oral exam negative for any thrush or erythema. No cervical lymphadenopathy. CVS: S1, S2 heard with regular rate and rhythm. No murmurs. LUNGS: Bilaterally  clear to auscultation. No crepitations. ABDOMEN: Soft, nontender. No hepatosplenomegaly. EXTREMITIES: No pedal edema.    LAB RESULTS:    Component Value Date/Time   NA 143 12/18/2011 0405   K 3.6 12/19/2011 0530   CL 110* 12/18/2011 0405   CO2 25 12/18/2011 0405   GLUCOSE 101* 12/18/2011 0405   BUN 7 12/18/2011 0405   CREATININE 0.71 12/18/2011 0405   CALCIUM 8.5 12/18/2011 0405   PROT 7.0 12/17/2011 1129   ALBUMIN 3.5 12/17/2011 1129   AST 16 12/17/2011 1129   ALT 16 12/17/2011 1129   ALKPHOS 91 12/17/2011 1129   BILITOT 0.7 12/17/2011 1129   GFRNONAA >60 12/18/2011 0405   GFRAA >60 12/18/2011 0405   Lab Results  Component Value Date   WBC 14.7* 10/18/2014   NEUTROABS 10.4* 10/18/2014   HGB 15.0 10/18/2014   HCT 45.6 10/18/2014   MCV 92.1 10/18/2014   PLT 334 10/18/2014     STUDIES: US Abdomen Complete  11/13/2014   CLINICAL DATA:  Elevated white blood cell count, positive ANA, elevated erythropoietin level  EXAM: ULTRASOUND ABDOMEN COMPLETE  COMPARISON:  Hepatobiliary scan of December 18, 2011 and abdominal ultrasound of December 17, 2011.  FINDINGS: Gallbladder: Again demonstrated are gallstones. A large mobile stone measures 2.3 cm in diameter. The gallbladder is mildly distended. There is no wall thickening, pericholecystic fluid, or positive sonographic Murphy's sign.  Common bile duct: Diameter: 5 mm  Liver: The hepatic echotexture is mildly increased. There is no focal mass nor ductal dilation.  IVC: No abnormality visualized.  Pancreas: Evaluation of the pancreatic head and tail was limited due to bowel gas.  Spleen: Size and appearance within normal limits.  Right Kidney: Length: 9.4 cm. The renal cortical echotexture remains lower than that of the adjacent liver. There are parapelvic cysts with the largest measuring 1.5 cm. There is no hydronephrosis.  Left Kidney: Length: 9.9 cm. The cortical echotexture is normal. There are parapelvic cysts with the largest  measuring 1.3 cm. There is no hydronephrosis.  Abdominal aorta: No aneurysm visualized. The maximal measured diameter is 2.9 cm proximally. There is mural plaque.  Other findings: There is no ascites.  IMPRESSION: 1. Mildly distended gallbladder which is a chronic finding. There are gallstones. There is no sonographic evidence of acute cholecystitis. 2. Fatty infiltrative change of the liver. Evaluation the pancreas is limited. The spleen is normal in size. 3. The kidneys are normal in contour. There are parapelvic cysts bilaterally. There is no evidence of hydronephrosis.   Electronically Signed   By: David  [place] M.D.   On: 11/13/2014 13:04     ASSESSMENT / PLAN:   Persistent leukocytosis, also had erythrocytosis in December 2015 - ? Reactive given positive ANA and RNP on workup, versus other etiology like myeloproliferative disorder. Also has mild elevation of serum erythropoietin and carboxyhemoglobin, nonsmoker. 10/18/14 - WBC 14.7, hemoglobin 15.0, platelets 334, neutrophils 71%, lymphocytes 19%, serum erythropoietin above normal range at 22, carboxyhemoglobin slightly elevated at 2.6%, ANA positive, RNP positive, LAP score normal at 79, [id number] mutation negative, JAK2 exon 12 mutation negative BCR-ABL study negative, peripheral blood flow cytometry unremarkable     -  reviewed labs done on July 14 and discussed with patient. She continues to have mild leukocytosis but no fevers or symptoms of infection. Hemoglobin is in the upper normal range and platelet count is normal. Given serologic evidence of autoimmune disease, we will refer to Rheumatology Dr.George W.Kernodle. In addition, given elevated serum erythropoietin level, will get ultrasound of the abdomen to evaluate for any liver or kidney masses. Otherwise hematology standpoint plan is to monitor CBC/differential once every 6 weeks, and see her back at 18 weeks and make further plan of management.  In between visits, the patient has been  advised to call or come to the ER in case of fevers, chills, bleeding, acute sickness or new symptoms. Patient agreeable to this plan.    Janese Banks, MD   11/16/2014 1:05 PM

## 2014-12-20 ENCOUNTER — Inpatient Hospital Stay: Payer: Medicare Other | Attending: Internal Medicine

## 2014-12-20 ENCOUNTER — Other Ambulatory Visit: Payer: Self-pay | Admitting: *Deleted

## 2014-12-20 DIAGNOSIS — D751 Secondary polycythemia: Secondary | ICD-10-CM

## 2014-12-20 DIAGNOSIS — D72829 Elevated white blood cell count, unspecified: Secondary | ICD-10-CM | POA: Diagnosis present

## 2014-12-20 DIAGNOSIS — R768 Other specified abnormal immunological findings in serum: Secondary | ICD-10-CM

## 2014-12-20 LAB — CBC WITH DIFFERENTIAL/PLATELET
BASOS ABS: 0.1 10*3/uL (ref 0–0.1)
Basophils Relative: 0 %
Eosinophils Absolute: 0.5 10*3/uL (ref 0–0.7)
Eosinophils Relative: 4 %
HEMATOCRIT: 43.8 % (ref 35.0–47.0)
Hemoglobin: 14.4 g/dL (ref 12.0–16.0)
LYMPHS PCT: 20 %
Lymphs Abs: 2.5 10*3/uL (ref 1.0–3.6)
MCH: 30 pg (ref 26.0–34.0)
MCHC: 32.8 g/dL (ref 32.0–36.0)
MCV: 91.3 fL (ref 80.0–100.0)
MONO ABS: 1 10*3/uL — AB (ref 0.2–0.9)
Monocytes Relative: 7 %
NEUTROS ABS: 8.9 10*3/uL — AB (ref 1.4–6.5)
Neutrophils Relative %: 69 %
Platelets: 308 10*3/uL (ref 150–440)
RBC: 4.8 MIL/uL (ref 3.80–5.20)
RDW: 14.4 % (ref 11.5–14.5)
WBC: 12.9 10*3/uL — ABNORMAL HIGH (ref 3.6–11.0)

## 2014-12-21 LAB — MISC LABCORP TEST (SEND OUT): Labcorp test code: 7187

## 2015-01-15 DIAGNOSIS — M79603 Pain in arm, unspecified: Secondary | ICD-10-CM | POA: Insufficient documentation

## 2015-01-31 ENCOUNTER — Inpatient Hospital Stay: Payer: Medicare Other | Attending: Family Medicine

## 2015-01-31 DIAGNOSIS — D72829 Elevated white blood cell count, unspecified: Secondary | ICD-10-CM | POA: Insufficient documentation

## 2015-01-31 DIAGNOSIS — R768 Other specified abnormal immunological findings in serum: Secondary | ICD-10-CM

## 2015-01-31 LAB — CBC WITH DIFFERENTIAL/PLATELET
BASOS ABS: 0 10*3/uL (ref 0–0.1)
BASOS PCT: 0 %
EOS ABS: 0.5 10*3/uL (ref 0–0.7)
Eosinophils Relative: 4 %
HEMATOCRIT: 45.8 % (ref 35.0–47.0)
HEMOGLOBIN: 15 g/dL (ref 12.0–16.0)
Lymphocytes Relative: 19 %
Lymphs Abs: 2.5 10*3/uL (ref 1.0–3.6)
MCH: 29.4 pg (ref 26.0–34.0)
MCHC: 32.8 g/dL (ref 32.0–36.0)
MCV: 89.6 fL (ref 80.0–100.0)
Monocytes Absolute: 0.9 10*3/uL (ref 0.2–0.9)
Monocytes Relative: 7 %
NEUTROS ABS: 9.2 10*3/uL — AB (ref 1.4–6.5)
NEUTROS PCT: 70 %
Platelets: 319 10*3/uL (ref 150–440)
RBC: 5.11 MIL/uL (ref 3.80–5.20)
RDW: 14.3 % (ref 11.5–14.5)
WBC: 13 10*3/uL — ABNORMAL HIGH (ref 3.6–11.0)

## 2015-03-14 ENCOUNTER — Encounter: Payer: Self-pay | Admitting: Internal Medicine

## 2015-03-14 ENCOUNTER — Inpatient Hospital Stay: Payer: Medicare Other | Attending: Family Medicine

## 2015-03-14 ENCOUNTER — Other Ambulatory Visit: Payer: Self-pay | Admitting: *Deleted

## 2015-03-14 ENCOUNTER — Inpatient Hospital Stay (HOSPITAL_BASED_OUTPATIENT_CLINIC_OR_DEPARTMENT_OTHER): Payer: Medicare Other | Admitting: Internal Medicine

## 2015-03-14 VITALS — BP 115/78 | HR 82 | Temp 98.7°F | Resp 18 | Ht 61.0 in | Wt 187.4 lb

## 2015-03-14 DIAGNOSIS — K219 Gastro-esophageal reflux disease without esophagitis: Secondary | ICD-10-CM | POA: Insufficient documentation

## 2015-03-14 DIAGNOSIS — Z7901 Long term (current) use of anticoagulants: Secondary | ICD-10-CM | POA: Insufficient documentation

## 2015-03-14 DIAGNOSIS — I2510 Atherosclerotic heart disease of native coronary artery without angina pectoris: Secondary | ICD-10-CM | POA: Diagnosis not present

## 2015-03-14 DIAGNOSIS — D72829 Elevated white blood cell count, unspecified: Secondary | ICD-10-CM

## 2015-03-14 DIAGNOSIS — Z79899 Other long term (current) drug therapy: Secondary | ICD-10-CM

## 2015-03-14 DIAGNOSIS — E785 Hyperlipidemia, unspecified: Secondary | ICD-10-CM | POA: Insufficient documentation

## 2015-03-14 DIAGNOSIS — I4891 Unspecified atrial fibrillation: Secondary | ICD-10-CM | POA: Diagnosis not present

## 2015-03-14 DIAGNOSIS — D751 Secondary polycythemia: Secondary | ICD-10-CM

## 2015-03-14 LAB — CBC WITH DIFFERENTIAL/PLATELET
Basophils Absolute: 0.1 10*3/uL (ref 0–0.1)
Basophils Relative: 1 %
EOS PCT: 5 %
Eosinophils Absolute: 0.6 10*3/uL (ref 0–0.7)
HEMATOCRIT: 43.4 % (ref 35.0–47.0)
Hemoglobin: 14.2 g/dL (ref 12.0–16.0)
LYMPHS ABS: 2.7 10*3/uL (ref 1.0–3.6)
LYMPHS PCT: 21 %
MCH: 28.9 pg (ref 26.0–34.0)
MCHC: 32.6 g/dL (ref 32.0–36.0)
MCV: 88.7 fL (ref 80.0–100.0)
MONO ABS: 0.9 10*3/uL (ref 0.2–0.9)
Monocytes Relative: 7 %
Neutro Abs: 8.6 10*3/uL — ABNORMAL HIGH (ref 1.4–6.5)
Neutrophils Relative %: 66 %
PLATELETS: 305 10*3/uL (ref 150–440)
RBC: 4.9 MIL/uL (ref 3.80–5.20)
RDW: 14.9 % — AB (ref 11.5–14.5)
WBC: 12.8 10*3/uL — ABNORMAL HIGH (ref 3.6–11.0)

## 2015-03-14 NOTE — Progress Notes (Signed)
Pt here, no hot flashes, night sweats, no fevers. No infections.

## 2015-03-14 NOTE — Progress Notes (Signed)
[facility]  Telephone:(336) [phone] Fax:(336) [phone]     ID: Luan MooreMarion J Mallicoat OB: 1926/06/06  MR#: [id number]  JYN#:[id number]#:[id number]  Patient Care Team: No Pcp Per Patient as PCP - General (General Practice)  CHIEF COMPLAINT/DIAGNOSIS:  Persistent leukocytosis, also had erythrocytosis in December 2015 - ? Reactive given positive ANA and RNP on workup, versus other etiology like myeloproliferative disorder. Also has mild elevation of serum erythropoietin and carboxyhemoglobin, nonsmoker.  10/18/14 - WBC 14.7, hemoglobin 15.0, platelets 334, neutrophils 71%, lymphocytes 19%, serum erythropoietin above normal range at 22, carboxyhemoglobin slightly elevated at 2.6%, ANA positive, RNP positive, LAP score normal at 79, [id number] mutation negative, JAK2 exon 12 mutation negative BCR-ABL study negative, peripheral blood flow cytometry unremarkable.  (Prior labs on 09/28/14 elevated WBC count of 12,500 with 69% neutrophils (ANC 8600), 21% lymphocytes (ALC 2700), mild relative monocytosis of 1200, hemoglobin 14.9, platelets 224. Prior to this WBC was 13.6 in December 2015, 9.9 in October 2011, 9.1 in July 2011. In Dec 2015 hemoglobin was above normal range at 15.6 and hematocrit was elevated at 48%).   HISTORY OF PRESENT ILLNESS: Mrs. Ty Hiltsdmonds returns to call clinic for follow-up visit. She has done reasonably well since her last appointment. She denies any particular new complaints. She is able to perform all activities of daily living with minimal difficulties. She has enjoyed her meals during Thanksgiving time and is looking forward to celebrating Christmas soon.   REVIEW OF SYSTEMS:   ROS As in HPI above. In addition, no fever, chills or sweats. No new headaches or focal weakness.  No new mood disturbances. No earache or discharge. No sore throat or dysphagia. No abdominal pain, constipation, diarrhea, dysuria or hematuria. No new bone pain. No skin rash or bleeding symptoms. No new  paresthesias in extremities.  PAST MEDICAL HISTORY: Reviewed. Past Medical History  Diagnosis Date  . A-fib   . Lumbar stenosis with neurogenic claudication   . GERD (gastroesophageal reflux disease)   . Osteoporosis   . Hyperlipemia   . CAD (coronary artery disease)     PAST SURGICAL HISTORY: Reviewed. As above  FAMILY HISTORY: Reviewed. No pertinent family history  SOCIAL HISTORY: Reviewed. Social History  Substance Use Topics  . Smoking status: Never Smoker   . Smokeless tobacco: Never Used  . Alcohol Use: No    Allergies  Allergen Reactions  . Rivaroxaban Itching    Current Outpatient Prescriptions  Medication Sig Dispense Refill  . apixaban (ELIQUIS) 5 MG TABS tablet Take 5 mg by mouth 2 (two) times daily.    . digoxin (LANOXIN) 0.125 MG tablet Take 0.125 mg by mouth daily.    Marland Kitchen. diltiazem (DILACOR XR) 240 MG 24 hr capsule Take 240 mg by mouth 2 (two) times daily.    . metoprolol succinate (TOPROL-XL) 50 MG 24 hr tablet Take 50 mg by mouth 3 (three) times daily. Take with or immediately following a meal.     No current facility-administered medications for this visit.    PHYSICAL EXAM: Filed Vitals:   03/14/15 1034  BP: 115/78  Pulse: 82  Temp: 98.7 F (37.1 C)  Resp: 18     Body mass index is 35.43 kg/(m^2).      GENERAL: Patient is alert and oriented and in no acute distress, elderly Caucasian female. There is no icterus. HEENT: Oral exam negative for any thrush or erythema. No cervical lymphadenopathy. CVS: S1, S2 heard with regular rate and rhythm. No murmurs. LUNGS: Bilaterally clear to auscultation.  No crepitations. ABDOMEN: Soft, nontender. No hepatosplenomegaly. EXTREMITIES: No pedal edema.    LAB RESULTS:    Component Value Date/Time   NA 143 12/18/2011 0405   K 3.6 12/19/2011 0530   CL 110* 12/18/2011 0405   CO2 25 12/18/2011 0405   GLUCOSE 101* 12/18/2011 0405   BUN 7 12/18/2011 0405   CREATININE 0.71 12/18/2011 0405   CALCIUM 8.5  12/18/2011 0405   PROT 7.0 12/17/2011 1129   ALBUMIN 3.5 12/17/2011 1129   AST 16 12/17/2011 1129   ALT 16 12/17/2011 1129   ALKPHOS 91 12/17/2011 1129   BILITOT 0.7 12/17/2011 1129   GFRNONAA >60 12/18/2011 0405   GFRNONAA >60 04/27/2011 1009   GFRAA >60 12/18/2011 0405   GFRAA >60 04/27/2011 1009   Lab Results  Component Value Date   WBC 13.0* 01/31/2015   NEUTROABS 9.2* 01/31/2015   HGB 15.0 01/31/2015   HCT 45.8 01/31/2015   MCV 89.6 01/31/2015   PLT 319 01/31/2015     STUDIES: No results found.   ASSESSMENT / PLAN:   Persistent leukocytosis, also had erythrocytosis in December 2015 - ? Reactive given positive ANA and RNP on workup, versus other etiology like myeloproliferative disorder. Also has mild elevation of serum erythropoietin and carboxyhemoglobin, nonsmoker. An extensive workup showed no evidence of hematologic malignancy, specifically CML or polycythemia vera. There was a question about the potential early CMML, but absolute monocyte count has returned to normal range. With a likelihood it would be useful to obtain an opinion from a rheumatologist specialist regarding the positivity for ANA and RNP, which could potentially indicate the presence of mixed connective tissue disease, but I cannot identify any symptoms or signs, which would be linked to such a diagnosis. Taking into account the fact that her hematologic parameters have been remarkably stable, and that the work up showed no evidence of hematologic malignancy, I feel that Mrs. Olenick does not require a follow-up appointment with hematology anymore. She should continue follow-up with her primary care physicians and if any issues or problems arise, which would require an opinion from a hematology specialist, she should be referred to [place]. Patient expressed understanding and agreement with this plan. No follow-up appointment is given. Referral to rheumatology is placed.  Gorden Harms, MD   03/14/2015 10:16  AM

## 2015-04-03 DIAGNOSIS — D72829 Elevated white blood cell count, unspecified: Secondary | ICD-10-CM | POA: Insufficient documentation

## 2015-04-03 DIAGNOSIS — R768 Other specified abnormal immunological findings in serum: Secondary | ICD-10-CM | POA: Insufficient documentation

## 2017-05-22 DIAGNOSIS — J111 Influenza due to unidentified influenza virus with other respiratory manifestations: Secondary | ICD-10-CM | POA: Insufficient documentation

## 2017-06-01 ENCOUNTER — Other Ambulatory Visit: Payer: Self-pay

## 2017-06-01 ENCOUNTER — Ambulatory Visit: Payer: Self-pay | Admitting: Family Medicine

## 2017-06-02 ENCOUNTER — Encounter: Payer: Self-pay | Admitting: Family Medicine

## 2017-06-02 ENCOUNTER — Ambulatory Visit (INDEPENDENT_AMBULATORY_CARE_PROVIDER_SITE_OTHER): Payer: Medicare Other | Admitting: Family Medicine

## 2017-06-02 VITALS — BP 102/68 | HR 147 | Resp 16 | Ht 61.0 in | Wt 180.0 lb

## 2017-06-02 DIAGNOSIS — R413 Other amnesia: Secondary | ICD-10-CM | POA: Diagnosis not present

## 2017-06-02 DIAGNOSIS — I482 Chronic atrial fibrillation, unspecified: Secondary | ICD-10-CM

## 2017-06-02 DIAGNOSIS — M47816 Spondylosis without myelopathy or radiculopathy, lumbar region: Secondary | ICD-10-CM | POA: Diagnosis not present

## 2017-06-02 DIAGNOSIS — E538 Deficiency of other specified B group vitamins: Secondary | ICD-10-CM | POA: Diagnosis not present

## 2017-06-02 DIAGNOSIS — R739 Hyperglycemia, unspecified: Secondary | ICD-10-CM | POA: Diagnosis not present

## 2017-06-02 DIAGNOSIS — B351 Tinea unguium: Secondary | ICD-10-CM | POA: Diagnosis not present

## 2017-06-02 DIAGNOSIS — I2510 Atherosclerotic heart disease of native coronary artery without angina pectoris: Secondary | ICD-10-CM | POA: Diagnosis not present

## 2017-06-02 DIAGNOSIS — Z23 Encounter for immunization: Secondary | ICD-10-CM | POA: Diagnosis not present

## 2017-06-02 DIAGNOSIS — Z7189 Other specified counseling: Secondary | ICD-10-CM | POA: Diagnosis not present

## 2017-06-02 NOTE — Progress Notes (Signed)
Date:  06/02/2017   Name:  Dominique French   DOB:  06/17/1926   MRN:  [id number]  PCP:  Schuyler Amor, MD    Chief Complaint: Establish Care (Doctor Letta Kocher was last PCP and retired. Recent admission to hospital 4 days D/C from [facility] 05/27/2017 ) and Pneumonia (Vomited on Abx in hospital and at home. Afib in hospital with this and had UTI. )   History of Present Illness:  This is a 84 y.o. female seen for initial visit. Recently hospitalized for afib with RVR, d/c 8d ago. Also had flu and UTI per daughter, s/p Tamiflu and doxy x 5d. Cough still productive yellow phlegm and SOB with exertion but getting stronger with home PT/OT. BLE resolved. Hx CAD/HTN, not taking meds as prescribed but family now using med box. On Eliquis x yrs, B12 low in hospital, taking supplement. Hx lumbar stenosis s/p injections in past but only bother intermittently now. Appetite poor but improving, thinks has lost weight. Memory slipping but declines cognitive testing today. Uses walker, no recent falls. TSH sl elevated last week, glucose high in past. Did not want to discuss DNR today. Father died in WWII, mother died 3 old age, sister with heart dz/OA. Never had tetanus, pneumo, or zoster imms, no colonoscopy in past, avoids doctors.  Review of Systems:  Review of Systems  Constitutional: Negative for chills and fever.  HENT: Negative for ear pain, sinus pain and trouble swallowing.   Eyes: Negative for pain.  Cardiovascular: Negative for chest pain and leg swelling.  Gastrointestinal: Negative for abdominal pain, constipation and diarrhea.  Endocrine: Negative for polydipsia and polyuria.  Genitourinary: Negative for difficulty urinating and dysuria.  Neurological: Negative for syncope and light-headedness.  Psychiatric/Behavioral: Negative for behavioral problems.    Patient Active Problem List   Diagnosis Date Noted  . B12 deficiency 06/02/2017  . Hyperglycemia 06/02/2017  . Onychomycosis  06/02/2017  . Memory loss 06/02/2017  . ANA positive 04/03/2015  . Leukocytosis 04/03/2015  . Arm pain 01/15/2015  . Benign essential hypertension 09/14/2014  . Moderate mitral insufficiency 05/23/2014  . Chronic atrial fibrillation (HCC) 01/25/2014  . Combined fat and carbohydrate induced hyperlipemia 01/25/2014  . Degenerative arthritis of lumbar spine 10/02/2013  . Lumbar canal stenosis 10/02/2013  . Breath shortness 09/13/2013  . Arteriosclerosis of coronary artery 02/03/2012  . CAD (coronary artery disease) 02/03/2012    Prior to Admission medications   Medication Sig Start Date End Date Taking? Authorizing Provider  acetaminophen (TYLENOL) 500 MG tablet Take 1,000 mg by mouth 2 (two) times daily as needed for pain.   Yes [provider]  apixaban (ELIQUIS) 5 MG TABS tablet Take 5 mg by mouth 2 (two) times daily.   Yes [provider]  cyanocobalamin 500 MCG tablet Take 500 mcg by mouth daily.   Yes [provider]  diltiazem (CARDIZEM CD) 240 MG 24 hr capsule Take 240 mg by mouth 2 (two) times daily.   Yes [provider]  metoprolol tartrate (LOPRESSOR) 50 MG tablet Take 50 mg by mouth 2 (two) times daily.   Yes [provider]  PROAIR HFA 108 (90 Base) MCG/ACT inhaler INL 2 PFS PO Q 4 H PRF WHZ OR SOB 05/25/17  Yes [provider]  Spacer/Aero-Holding Chambers (VALVED HOLDING CHAMBER) DEVI U UTD 05/26/17  Yes [provider]    Allergies  Allergen Reactions  . Rivaroxaban Itching    History reviewed. No pertinent surgical history.  Social History  Tobacco Use  . Smoking status: Never Smoker  . Smokeless tobacco: Never Used  Substance Use Topics  . Alcohol use: No  . Drug use: No    Family History  Problem Relation Age of Onset  . Stomach cancer Father     Medication list has been reviewed and updated.  Physical Examination: BP 102/68   Pulse (!) 147   Resp 16   Ht 5\' 1"  (1.549 m)   Wt 180 lb  (81.6 kg)   SpO2 99%   BMI 34.01 kg/m   Physical Exam  Constitutional: She is oriented to person, place, and time. She appears well-developed and well-nourished. No distress.  HENT:  Head: Normocephalic and atraumatic.  Right Ear: External ear normal.  Left Ear: External ear normal.  Nose: Nose normal.  Mouth/Throat: Oropharynx is clear and moist.  TMs clear  Eyes: Conjunctivae and EOM are normal. Pupils are equal, round, and reactive to light.  Neck: Neck supple. No thyromegaly present.  Cardiovascular: Normal heart sounds and intact distal pulses.  Rapid irregular rhythm  Pulmonary/Chest: Effort normal.  Diffuse exp wheezes  Abdominal: Soft. She exhibits no distension and no mass. There is no tenderness.  Musculoskeletal: She exhibits no edema.  Lymphadenopathy:    She has no cervical adenopathy.  Neurological: She is alert and oriented to person, place, and time. Coordination normal.  Romberg positive, gait unsteady  Skin: Skin is warm and dry. She is not diaphoretic.  Onychomycosis multiple toenails  Psychiatric: She has a normal mood and affect. Her behavior is normal.  Nursing note and vitals reviewed.   Assessment and Plan:  1. Chronic atrial fibrillation (HCC) With RVR, likely due to med noncompliance, family to assure takes metoprolol and diltiazem bid, cont Eliquis - Comprehensive Metabolic Panel (CMET) - CBC  2. Coronary artery disease involving native coronary artery of native heart without angina pectoris Stable on BB/Eliquis, consider statin - Lipid Profile  3. Osteoarthritis of lumbar spine, unspecified spinal osteoarthritis complication status S/p injections in past, Tylenol prn for now  4. Hyperglycemia - HgB A1c  5. Onychomycosis - Ambulatory referral to Podiatry  6. Memory loss Cognitive testing/GDS next visit  7. B12 deficiency On supplement x 1 week - B12  8. Advance care planning DNR mentioned today, wants to discuss next visit  9.  Need for pneumococcal vaccination - Pneumococcal conjugate vaccine 13-valent  10. HM Consider Tdap/Shingrix next visit  Return in about 1 week (around 06/09/2017).   One hour spent with pt/family over half in counseling  Daje Stark M. Kingsley SpittlePlonk, Jr. MD [facility]  06/02/2017

## 2017-06-03 LAB — COMPREHENSIVE METABOLIC PANEL
ALT: 15 IU/L (ref 0–32)
AST: 21 IU/L (ref 0–40)
Albumin/Globulin Ratio: 1.8 (ref 1.2–2.2)
Albumin: 4.1 g/dL (ref 3.5–4.7)
Alkaline Phosphatase: 89 IU/L (ref 39–117)
BILIRUBIN TOTAL: 1.2 mg/dL (ref 0.0–1.2)
BUN/Creatinine Ratio: 10 — ABNORMAL LOW (ref 12–28)
BUN: 10 mg/dL (ref 8–27)
CALCIUM: 9.1 mg/dL (ref 8.7–10.3)
CHLORIDE: 104 mmol/L (ref 96–106)
CO2: 23 mmol/L (ref 20–29)
Creatinine, Ser: 1.05 mg/dL — ABNORMAL HIGH (ref 0.57–1.00)
GFR, EST AFRICAN AMERICAN: 55 mL/min/{1.73_m2} — AB (ref >59)
GFR, EST NON AFRICAN AMERICAN: 48 mL/min/{1.73_m2} — AB (ref >59)
GLUCOSE: 129 mg/dL — AB (ref 65–99)
Globulin, Total: 2.3 g/dL (ref 1.5–4.5)
Potassium: 4 mmol/L (ref 3.5–5.2)
Sodium: 146 mmol/L — ABNORMAL HIGH (ref 134–144)
Total Protein: 6.4 g/dL (ref 6.0–8.5)

## 2017-06-03 LAB — LIPID PANEL
Chol/HDL Ratio: 3.1 ratio (ref 0.0–4.4)
Cholesterol, Total: 150 mg/dL (ref 100–199)
HDL: 49 mg/dL (ref >39)
LDL CALC: 71 mg/dL (ref 0–99)
Triglycerides: 148 mg/dL (ref 0–149)
VLDL CHOLESTEROL CAL: 30 mg/dL (ref 5–40)

## 2017-06-03 LAB — CBC
HEMATOCRIT: 44.6 % (ref 34.0–46.6)
HEMOGLOBIN: 15.6 g/dL (ref 11.1–15.9)
MCH: 34.7 pg — ABNORMAL HIGH (ref 26.6–33.0)
MCHC: 35 g/dL (ref 31.5–35.7)
MCV: 99 fL — ABNORMAL HIGH (ref 79–97)
Platelets: 349 10*3/uL (ref 150–379)
RBC: 4.5 x10E6/uL (ref 3.77–5.28)
RDW: 16.5 % — ABNORMAL HIGH (ref 12.3–15.4)
WBC: 11.5 10*3/uL — ABNORMAL HIGH (ref 3.4–10.8)

## 2017-06-03 LAB — VITAMIN B12: Vitamin B-12: 1528 pg/mL — ABNORMAL HIGH (ref 232–1245)

## 2017-06-03 LAB — HEMOGLOBIN A1C
ESTIMATED AVERAGE GLUCOSE: 126 mg/dL
Hgb A1c MFr Bld: 6 % — ABNORMAL HIGH (ref 4.8–5.6)

## 2017-06-07 ENCOUNTER — Encounter: Payer: Self-pay | Admitting: Family Medicine

## 2017-06-07 DIAGNOSIS — N183 Chronic kidney disease, stage 3 unspecified: Secondary | ICD-10-CM | POA: Insufficient documentation

## 2017-06-09 ENCOUNTER — Ambulatory Visit (INDEPENDENT_AMBULATORY_CARE_PROVIDER_SITE_OTHER): Payer: Medicare Other | Admitting: Family Medicine

## 2017-06-09 ENCOUNTER — Ambulatory Visit: Payer: Medicare Other

## 2017-06-09 ENCOUNTER — Encounter: Payer: Self-pay | Admitting: Family Medicine

## 2017-06-09 VITALS — BP 110/62 | HR 74 | Temp 97.7°F | Resp 12 | Ht 61.0 in | Wt 181.2 lb

## 2017-06-09 VITALS — BP 110/62 | HR 72 | Resp 16 | Ht 61.0 in | Wt 181.0 lb

## 2017-06-09 DIAGNOSIS — E538 Deficiency of other specified B group vitamins: Secondary | ICD-10-CM

## 2017-06-09 DIAGNOSIS — Z0000 Encounter for general adult medical examination without abnormal findings: Secondary | ICD-10-CM

## 2017-06-09 DIAGNOSIS — J40 Bronchitis, not specified as acute or chronic: Secondary | ICD-10-CM | POA: Diagnosis not present

## 2017-06-09 DIAGNOSIS — I10 Essential (primary) hypertension: Secondary | ICD-10-CM | POA: Diagnosis not present

## 2017-06-09 DIAGNOSIS — N183 Chronic kidney disease, stage 3 unspecified: Secondary | ICD-10-CM

## 2017-06-09 DIAGNOSIS — R7303 Prediabetes: Secondary | ICD-10-CM | POA: Diagnosis not present

## 2017-06-09 DIAGNOSIS — I482 Chronic atrial fibrillation, unspecified: Secondary | ICD-10-CM

## 2017-06-09 DIAGNOSIS — I2510 Atherosclerotic heart disease of native coronary artery without angina pectoris: Secondary | ICD-10-CM

## 2017-06-09 MED ORDER — BENZONATATE 200 MG PO CAPS: 200.0000 mg | ORAL_CAPSULE | Freq: Two times a day (BID) | ORAL | 0 refills | Status: AC | PRN

## 2017-06-09 NOTE — Patient Instructions (Signed)
Dominique French , Thank you for taking time to come for your Medicare Wellness Visit. I appreciate your ongoing commitment to your health goals. Please review the following plan we discussed and let me know if I can assist you in the future.   Screening recommendations/referrals: Colorectal Screening: No longer required Mammogram: No longer required Bone Density: No longer required  Vision/Dental/Diabetic Exams: Recommended yearly ophthalmology/optometry visit for glaucoma screening and checkup Recommended yearly dental visit for hygiene and checkup  Vaccinations: Influenza vaccine: Declined Pneumococcal vaccine: Completed 1 of 2. PPSV23 Due on or after 06/02/18 Tdap vaccine: Declined. Please call your insurance company to determine your out of pocket expense. You may also receive this vaccine at your local pharmacy or Health Dept. Shingles vaccine: Please call your insurance company to determine your out of pocket expense for the Shingrix vaccine. You may also receive this vaccine at your local pharmacy or Health Dept.   Advanced directives: Advance directive discussed with you today. I have provided a copy for you to complete at home and have notarized. Once this is complete please bring a copy in to our office so we can scan it into your chart.  Conditions/risks identified: Recommend to drink at least 6-8 8oz glasses of water per day.  Next appointment: Please schedule your Annual Wellness Visit with your Nurse Health Advisor in one year.  Preventive Care 38 Years and Older, Female Preventive care refers to lifestyle choices and visits with your health care provider that can promote health and wellness. What does preventive care include?  A yearly physical exam. This is also called an annual well check.  Dental exams once or twice a year.  Routine eye exams. Ask your health care provider how often you should have your eyes checked.  Personal lifestyle choices, including:  Daily care  of your teeth and gums.  Regular physical activity.  Eating a healthy diet.  Avoiding tobacco and drug use.  Limiting alcohol use.  Practicing safe sex.  Taking low-dose aspirin every day.  Taking vitamin and mineral supplements as recommended by your health care provider. What happens during an annual well check? The services and screenings done by your health care provider during your annual well check will depend on your age, overall health, lifestyle risk factors, and family history of disease. Counseling  Your health care provider may ask you questions about your:  Alcohol use.  Tobacco use.  Drug use.  Emotional well-being.  Home and relationship well-being.  Sexual activity.  Eating habits.  History of falls.  Memory and ability to understand (cognition).  Work and work Astronomer.  Reproductive health. Screening  You may have the following tests or measurements:  Height, weight, and BMI.  Blood pressure.  Lipid and cholesterol levels. These may be checked every 5 years, or more frequently if you are over 44 years old.  Skin check.  Lung cancer screening. You may have this screening every year starting at age 40 if you have a 30-pack-year history of smoking and currently smoke or have quit within the past 15 years.  Fecal occult blood test (FOBT) of the stool. You may have this test every year starting at age 88.  Flexible sigmoidoscopy or colonoscopy. You may have a sigmoidoscopy every 5 years or a colonoscopy every 10 years starting at age 75.  Hepatitis C blood test.  Hepatitis B blood test.  Sexually transmitted disease (STD) testing.  Diabetes screening. This is done by checking your blood sugar (glucose) after you  have not eaten for a while (fasting). You may have this done every 1-3 years.  Bone density scan. This is done to screen for osteoporosis. You may have this done starting at age 82.  Mammogram. This may be done every 1-2  years. Talk to your health care provider about how often you should have regular mammograms. Talk with your health care provider about your test results, treatment options, and if necessary, the need for more tests. Vaccines  Your health care provider may recommend certain vaccines, such as:  Influenza vaccine. This is recommended every year.  Tetanus, diphtheria, and acellular pertussis (Tdap, Td) vaccine. You may need a Td booster every 10 years.  Zoster vaccine. You may need this after age 82.  Pneumococcal 13-valent conjugate (PCV13) vaccine. One dose is recommended after age 82.  Pneumococcal polysaccharide (PPSV23) vaccine. One dose is recommended after age 165. Talk to your health care provider about which screenings and vaccines you need and how often you need them. This information is not intended to replace advice given to you by your health care provider. Make sure you discuss any questions you have with your health care provider. Document Released: 04/19/2015 Document Revised: 12/11/2015 Document Reviewed: 01/22/2015 Elsevier Interactive Patient Education  2017 [employer]  Fall Prevention in the Home Falls can cause injuries. They can happen to people of all ages. There are many things you can do to make your home safe and to help prevent falls. What can I do on the outside of my home?  Regularly fix the edges of walkways and driveways and fix any cracks.  Remove anything that might make you trip as you walk through a door, such as a raised step or threshold.  Trim any bushes or trees on the path to your home.  Use bright outdoor lighting.  Clear any walking paths of anything that might make someone trip, such as rocks or tools.  Regularly check to see if handrails are loose or broken. Make sure that both sides of any steps have handrails.  Any raised decks and porches should have guardrails on the edges.  Have any leaves, snow, or ice cleared regularly.  Use  sand or salt on walking paths during winter.  Clean up any spills in your garage right away. This includes oil or grease spills. What can I do in the bathroom?  Use night lights.  Install grab bars by the toilet and in the tub and shower. Do not use towel bars as grab bars.  Use non-skid mats or decals in the tub or shower.  If you need to sit down in the shower, use a plastic, non-slip stool.  Keep the floor dry. Clean up any water that spills on the floor as soon as it happens.  Remove soap buildup in the tub or shower regularly.  Attach bath mats securely with double-sided non-slip rug tape.  Do not have throw rugs and other things on the floor that can make you trip. What can I do in the bedroom?  Use night lights.  Make sure that you have a light by your bed that is easy to reach.  Do not use any sheets or blankets that are too big for your bed. They should not hang down onto the floor.  Have a firm chair that has side arms. You can use this for support while you get dressed.  Do not have throw rugs and other things on the floor that can make you trip. What  can I do in the kitchen?  Clean up any spills right away.  Avoid walking on wet floors.  Keep items that you use a lot in easy-to-reach places.  If you need to reach something above you, use a strong step stool that has a grab bar.  Keep electrical cords out of the way.  Do not use floor polish or wax that makes floors slippery. If you must use wax, use non-skid floor wax.  Do not have throw rugs and other things on the floor that can make you trip. What can I do with my stairs?  Do not leave any items on the stairs.  Make sure that there are handrails on both sides of the stairs and use them. Fix handrails that are broken or loose. Make sure that handrails are as long as the stairways.  Check any carpeting to make sure that it is firmly attached to the stairs. Fix any carpet that is loose or worn.  Avoid  having throw rugs at the top or bottom of the stairs. If you do have throw rugs, attach them to the floor with carpet tape.  Make sure that you have a light switch at the top of the stairs and the bottom of the stairs. If you do not have them, ask someone to add them for you. What else can I do to help prevent falls?  Wear shoes that:  Do not have high heels.  Have rubber bottoms.  Are comfortable and fit you well.  Are closed at the toe. Do not wear sandals.  If you use a stepladder:  Make sure that it is fully opened. Do not climb a closed stepladder.  Make sure that both sides of the stepladder are locked into place.  Ask someone to hold it for you, if possible.  Clearly mark and make sure that you can see:  Any grab bars or handrails.  First and last steps.  Where the edge of each step is.  Use tools that help you move around (mobility aids) if they are needed. These include:  Canes.  Walkers.  Scooters.  Crutches.  Turn on the lights when you go into a dark area. Replace any light bulbs as soon as they burn out.  Set up your furniture so you have a clear path. Avoid moving your furniture around.  If any of your floors are uneven, fix them.  If there are any pets around you, be aware of where they are.  Review your medicines with your doctor. Some medicines can make you feel dizzy. This can increase your chance of falling. Ask your doctor what other things that you can do to help prevent falls. This information is not intended to replace advice given to you by your health care provider. Make sure you discuss any questions you have with your health care provider. Document Released: 01/17/2009 Document Revised: 08/29/2015 Document Reviewed: 04/27/2014 Elsevier Interactive Patient Education  2017 [employer].

## 2017-06-09 NOTE — Progress Notes (Signed)
Subjective:   Dominique French is a 84 y.o. female who presents for an Initial Medicare Annual Wellness Visit.  Review of Systems    N/A  Cardiac Risk Factors include: advanced age (>10455men, 68>65 women);diabetes mellitus;dyslipidemia;hypertension;obesity (BMI >30kg/m2);sedentary lifestyle     Objective:    Today's Vitals   06/09/17 1516  BP: 110/62  Pulse: 74  Resp: 12  Temp: 97.7 F (36.5 C)  TempSrc: Oral  SpO2: 94%  Weight: 181 lb 3.2 oz (82.2 kg)  Height: 5\' 1"  (1.549 m)   Body mass index is 34.24 kg/m.  Advanced Directives 06/09/2017 03/14/2015 11/08/2014 10/18/2014  Does Patient Have a Medical Advance Directive? No No No No  Would patient like information on creating a medical advance directive? Yes (MAU/Ambulatory/Procedural Areas - Information given) No - patient declined information No - patient declined information Yes - Educational materials given    Current Medications (verified) Outpatient Encounter Medications as of 06/09/2017  Medication Sig  . acetaminophen (TYLENOL) 500 MG tablet Take 1,000 mg by mouth 2 (two) times daily as needed for pain.  Marland Kitchen. apixaban (ELIQUIS) 5 MG TABS tablet Take 5 mg by mouth 2 (two) times daily.  . cyanocobalamin 500 MCG tablet Take 500 mcg by mouth daily.  Marland Kitchen. diltiazem (CARDIZEM CD) 240 MG 24 hr capsule Take 240 mg by mouth 2 (two) times daily.  . metoprolol tartrate (LOPRESSOR) 50 MG tablet Take 50 mg by mouth 2 (two) times daily.  Marland Kitchen. PROAIR HFA 108 (90 Base) MCG/ACT inhaler INL 2 PFS PO Q 4 H PRF WHZ OR SOB  . Spacer/Aero-Holding Chambers (VALVED HOLDING CHAMBER) DEVI U UTD   No facility-administered encounter medications on file as of 06/09/2017.     Allergies (verified) Rivaroxaban   History: Past Medical History:  Diagnosis Date  . A-fib (HCC)   . CAD (coronary artery disease)   . Erythrocytosis   . GERD (gastroesophageal reflux disease)   . Hyperlipemia   . Hypertension   . Leukocytosis   . Lumbar stenosis with neurogenic  claudication   . Osteoporosis    History reviewed. No pertinent surgical history. Family History  Problem Relation Age of Onset  . Stomach cancer Father   . Healthy Mother   . Healthy Sister    Social History   Socioeconomic History  . Marital status: Widowed    Spouse name: None  . Number of children: 10  . Years of education: None  . Highest education level: 12th grade  Social Needs  . Financial resource strain: Not hard at all  . Food insecurity - worry: Never true  . Food insecurity - inability: Never true  . Transportation needs - medical: No  . Transportation needs - non-medical: No  Occupational History  . Occupation: Retired  Tobacco Use  . Smoking status: Never Smoker  . Smokeless tobacco: Never Used  . Tobacco comment: smoking cessation materials not required  Substance and Sexual Activity  . Alcohol use: No  . Drug use: No  . Sexual activity: Not Currently  Other Topics Concern  . None  Social History Narrative  . None    Tobacco Counseling Counseling given: No Comment: smoking cessation materials not required   Clinical Intake:  Pre-visit preparation completed: Yes  Pain : No/denies pain   BMI - recorded: 34.24 Nutritional Status: BMI > 30  Obese Nutritional Risks: None Diabetes: No  How often do you need to have someone help you when you read instructions, pamphlets, or other written materials  from your doctor or pharmacy?: 1 - Never     Information entered by :: AEversole, LPN   Activities of Daily Living In your present state of health, do you have any difficulty performing the following activities: 06/09/2017 06/02/2017  Hearing? N N  Comment denies hearing aids -  Vision? N N  Comment wears reading glasses -  Difficulty concentrating or making decisions? N N  Walking or climbing stairs? Y Y  Comment avoids d/t SOB -  Dressing or bathing? N N  Doing errands, shopping? N Y  Quarry manager and eating ? N -  Comment full set upper  and lower dentures -  Using the Toilet? N -  In the past six months, have you accidently leaked urine? Y -  Comment stress incontinence -  Do you have problems with loss of bowel control? N -  Managing your Medications? N -  Managing your Finances? N -  Housekeeping or managing your Housekeeping? Y -  Comment dtr manages home -  Some recent data might be hidden     Immunizations and Health Maintenance Immunization History  Administered Date(s) Administered  . Pneumococcal Conjugate-13 06/02/2017   There are no preventive care reminders to display for this patient.  Patient Care Team: Schuyler Amor, MD as PCP - General (Family Medicine) Lamar Blinks, MD as Consulting Physician (Cardiology)  Indicate any recent Medical Services you may have received from other than [facility] providers in the past year (date may be approximate).     Assessment:   This is a routine wellness examination for [facility].  Hearing/Vision screen Vision Screening Comments: Not established with a provider. Pt provided with a list of providers and advised to schedule an appt to establish care  Dietary issues and exercise activities discussed: Current Exercise Habits: Structured exercise class, Type of exercise: strength training/weights, Time (Minutes): 10, Frequency (Times/Week): 3, Weekly Exercise (Minutes/Week): 30, Intensity: Mild, Exercise limited by: respiratory conditions(s);orthopedic condition(s)(PT for strengthening)  Goals    . DIET - INCREASE WATER INTAKE     Recommend to drink at least 6-8 8oz glasses of water per day.      Depression Screen PHQ 2/9 Scores 06/09/2017 06/02/2017  PHQ - 2 Score 0 0  PHQ- 9 Score 0 -    Fall Risk Fall Risk  06/09/2017 06/02/2017  Falls in the past year? No No  Risk for fall due to : Impaired balance/gait;Impaired vision -  Risk for fall due to: Comment wears glasses, uses wheeled walker, PT for strengthening -    Is the patient's home free of loose throw  rugs in walkways, pet beds, electrical cords, etc?   Yes Does the patient have any grab bars in the bathroom? Yes  Does the patient use a shower chair when bathing? Yes Does the patient have any stairs in or around the home? No If so, are there any handrails?  N/A Does the patient have adequate lighting?  Yes Does the patient use a cane, walker or w/c? Yes, wheeled walker Does the patient use of an elevated toilet seat? Yes  Timed Get Up and Go Performed: Yes. Pt ambulated 10 feet within 23 sec. Gait slow, steady and with the use of an assistive device. Seeing PT 3 times per week for strengthening. Fall risk prevention has been discussed.  Community Resource Referral not required at this time.  Cognitive Function:     6CIT Screen 06/09/2017  What Year? 0 points  What month? 0 points  What  time? 0 points  Count back from 20 0 points  Months in reverse 2 points  Repeat phrase 8 points  Total Score 10    Screening Tests Health Maintenance  Topic Date Due  . INFLUENZA VACCINE  02/09/2018 (Originally 11/04/2016)  . TETANUS/TDAP  06/10/2018 (Originally 07/30/1945)  . PNA vac Low Risk Adult (2 of 2 - PPSV23) 06/02/2018  . DEXA SCAN  Discontinued    Qualifies for Shingles Vaccine? Yes. Due for Zostavax or Shingrix vaccine. Education has been provided regarding the importance of this vaccine. Pt has been advised to call her insurance company to determine her out of pocket expense. Advised she may also receive this vaccine at her local pharmacy or Health Dept. Verbalized acceptance and understanding.  Due for Flu vaccine. Declined my offer to administer today. Education has been provided regarding the importance of this vaccine but still declined. Pt has been advised to call our office is she should change her mind and wish to receive this vaccine. Also advised she may receive this vaccine at her local pharmacy or Health Dept. Verbalized acceptance and understanding.  Due for Tdap vaccine.  Education has been provided regarding the importance of this vaccine, however, pt has been advised that this vaccine is NOT covered by Medicare as a preventative vaccination. She is welcome to receive this vacine today but will be responsible for any out of pocket expenses incurred as a result of receiving this vaccine. Pt has been advised she may receive this vaccine at her local pharmacy or Health Dept. Also advised to provide a copy of her vaccination record if she chooses to receive this vaccine at his/her local pharmacy. Verbalized acceptance and understanding.  Cancer Screenings: Lung: Low Dose CT Chest recommended if Age 39-80 years, 30 pack-year currently smoking OR have quit w/in 15years. Patient does not qualify. Breast Screening: No longer required Bone Density: No longer required Colorectal: No longer required  Additional Screenings: Hepatitis B/HIV/Syphillis: Does not qualify Hepatitis C Screening: Does not qualify    Plan:  I have personally reviewed and addressed the Medicare Annual Wellness questionnaire and have noted the following in the patient's chart:  A. Medical and social history B. Use of alcohol, tobacco or illicit drugs  C. Current medications and supplements D. Functional ability and status E.  Nutritional status F.  Physical activity G. Advance directives H. List of other physicians I.  Hospitalizations, surgeries, and ER visits in previous 12 months J.  Vitals K. Screenings such as hearing and vision if needed, cognitive and depression L. Referrals and appointments - none  In addition, I have reviewed and discussed with patient certain preventive protocols, quality metrics, and best practice recommendations. A written personalized care plan for preventive services as well as general preventive health recommendations were provided to patient.  Signed,  Deon Pilling, LPN Nurse Health Advisor  MD Recommendations: Due for Zostavax or Shingrix vaccine.  Education has been provided regarding the importance of this vaccine. Pt has been advised to call her insurance company to determine her out of pocket expense. Advised she may also receive this vaccine at her local pharmacy or Health Dept. Verbalized acceptance and understanding.  Due for Flu vaccine. Declined my offer to administer today. Education has been provided regarding the importance of this vaccine but still declined. Pt has been advised to call our office is she should change her mind and wish to receive this vaccine. Also advised she may receive this vaccine at her local pharmacy or Health  Dept. Verbalized acceptance and understanding.  Due for Tdap vaccine. Education has been provided regarding the importance of this vaccine, however, pt has been advised that this vaccine is NOT covered by Medicare as a preventative vaccination. She is welcome to receive this vacine today but will be responsible for any out of pocket expenses incurred as a result of receiving this vaccine. Pt has been advised she may receive this vaccine at her local pharmacy or Health Dept. Also advised to provide a copy of her vaccination record if she chooses to receive this vaccine at his/her local pharmacy. Verbalized acceptance and understanding.

## 2017-06-10 ENCOUNTER — Other Ambulatory Visit: Payer: Self-pay | Admitting: Family Medicine

## 2017-06-10 ENCOUNTER — Encounter: Payer: Self-pay | Admitting: Family Medicine

## 2017-06-10 DIAGNOSIS — R7303 Prediabetes: Secondary | ICD-10-CM | POA: Insufficient documentation

## 2017-06-10 DIAGNOSIS — E559 Vitamin D deficiency, unspecified: Secondary | ICD-10-CM | POA: Insufficient documentation

## 2017-06-10 LAB — BASIC METABOLIC PANEL
BUN/Creatinine Ratio: 13 (ref 12–28)
BUN: 11 mg/dL (ref 8–27)
CHLORIDE: 106 mmol/L (ref 96–106)
CO2: 23 mmol/L (ref 20–29)
Calcium: 9.1 mg/dL (ref 8.7–10.3)
Creatinine, Ser: 0.88 mg/dL (ref 0.57–1.00)
GFR calc Af Amer: 68 mL/min/{1.73_m2} (ref >59)
GFR calc non Af Amer: 59 mL/min/{1.73_m2} — ABNORMAL LOW (ref >59)
GLUCOSE: 121 mg/dL — AB (ref 65–99)
POTASSIUM: 3.8 mmol/L (ref 3.5–5.2)
SODIUM: 146 mmol/L — AB (ref 134–144)

## 2017-06-10 LAB — VITAMIN D 25 HYDROXY (VIT D DEFICIENCY, FRACTURES): Vit D, 25-Hydroxy: 4.4 ng/mL — ABNORMAL LOW (ref 30.0–100.0)

## 2017-06-10 MED ORDER — VITAMIN D3 125 MCG (5000 UT) PO CAPS: 1.0000 | ORAL_CAPSULE | Freq: Every day | ORAL | Status: AC

## 2017-06-10 NOTE — Progress Notes (Signed)
Date:  06/09/2017   Name:  Dominique French   DOB:  1926-12-15   MRN:  [id number]  PCP:  Schuyler AmorPlonk, Kerrick Miler, MD    Chief Complaint: Atrial Fibrillation (follow up )   History of Present Illness:  This is a 84 y.o. female seen for one week f/u. Taking metoprolol and diltiazem regularly now, less SOB. To see cards 07/09/17. Blood work showed CKD3 and prediabetes, both new per pt/family. C/o persistent cough x 2 wks prod white phlegm. Declines Tdap/Shingrix, has not yet seen podiatry.  Review of Systems:  Review of Systems  Constitutional: Negative for chills and fever.  HENT: Negative for sore throat.   Cardiovascular: Negative for chest pain and leg swelling.  Genitourinary: Negative for difficulty urinating.  Neurological: Negative for syncope and light-headedness.    Patient Active Problem List   Diagnosis Date Noted  . Prediabetes 06/10/2017  . CKD (chronic kidney disease) stage 3, GFR 30-59 ml/min (HCC) 06/07/2017  . B12 deficiency 06/02/2017  . Hyperglycemia 06/02/2017  . Onychomycosis 06/02/2017  . Memory loss 06/02/2017  . ANA positive 04/03/2015  . Leukocytosis 04/03/2015  . Arm pain 01/15/2015  . Benign essential hypertension 09/14/2014  . Moderate mitral insufficiency 05/23/2014  . Chronic atrial fibrillation (HCC) 01/25/2014  . Combined fat and carbohydrate induced hyperlipemia 01/25/2014  . Degenerative arthritis of lumbar spine 10/02/2013  . Lumbar canal stenosis 10/02/2013  . Breath shortness 09/13/2013  . Arteriosclerosis of coronary artery 02/03/2012  . CAD (coronary artery disease) 02/03/2012    Prior to Admission medications   Medication Sig Start Date End Date Taking? Authorizing Provider  acetaminophen (TYLENOL) 500 MG tablet Take 1,000 mg by mouth 2 (two) times daily as needed for pain.   Yes [provider]  apixaban (ELIQUIS) 5 MG TABS tablet Take 5 mg by mouth 2 (two) times daily.   Yes [provider]  cyanocobalamin 500 MCG tablet  Take 500 mcg by mouth daily.   Yes [provider]  diltiazem (CARDIZEM CD) 240 MG 24 hr capsule Take 240 mg by mouth 2 (two) times daily.   Yes [provider]  metoprolol tartrate (LOPRESSOR) 50 MG tablet Take 50 mg by mouth 2 (two) times daily.   Yes [provider]  PROAIR HFA 108 (90 Base) MCG/ACT inhaler INL 2 PFS PO Q 4 H PRF WHZ OR SOB 05/25/17  Yes [provider]  Spacer/Aero-Holding Chambers (VALVED HOLDING CHAMBER) DEVI U UTD 05/26/17  Yes [provider]  benzonatate (TESSALON) 200 MG capsule Take 1 capsule (200 mg total) by mouth 2 (two) times daily as needed for cough. 06/09/17   Schuyler AmorPlonk, Miciah Covelli, MD    Allergies  Allergen Reactions  . Rivaroxaban Itching    History reviewed. No pertinent surgical history.  Social History   Tobacco Use  . Smoking status: Never Smoker  . Smokeless tobacco: Never Used  . Tobacco comment: smoking cessation materials not required  Substance Use Topics  . Alcohol use: No  . Drug use: No    Family History  Problem Relation Age of Onset  . Stomach cancer Father   . Healthy Mother   . Healthy Sister     Medication list has been reviewed and updated.  Physical Examination: BP 110/62   Pulse 72   Resp 16   Ht 5\' 1"  (1.549 m)   Wt 181 lb (82.1 kg)   SpO2 98%   BMI 34.20 kg/m   Physical Exam  Constitutional: She appears well-developed and  well-nourished.  Cardiovascular: Normal rate.  IRRR  Pulmonary/Chest: Effort normal and breath sounds normal.  Musculoskeletal: She exhibits no edema.  Neurological: She is alert.  Skin: Skin is warm and dry.  Psychiatric: She has a normal mood and affect. Her behavior is normal.  Nursing note and vitals reviewed.   Assessment and Plan:  1. Chronic atrial fibrillation (HCC) Improved on regular metoprolol/diltiazem/Eliquis, to see cards next month  2. CKD (chronic kidney disease) stage 3, GFR 30-59 ml/min (HCC) New onset per pt/family,  encouraging PO fluids, consider ACEI/ARB - Basic Metabolic Panel (BMET) - Vitamin D (25 hydroxy)  3. Coronary artery disease involving native coronary artery of native heart without angina pectoris Stable on BB/Eliquis, no clear indication for statin given LDL 71  4. Benign essential hypertension Well controlled on metoprolol/diltiazem  5. Prediabetes Dx/px discussed, will monitor  6. Bronchitis with persistent cough Tessalon prn  7. B12 deficiency Improved on supplement (received initial B12 injection in hospital)   8. Healthcare maintenance Declines Tdap/Shingrix  Return in about 4 weeks (around 07/07/2017).  [name]. Kingsley Spittle MD [facility]  06/10/2017

## 2017-07-07 ENCOUNTER — Ambulatory Visit: Payer: Medicare Other | Admitting: Family Medicine

## 2017-07-25 IMAGING — US US ABDOMEN COMPLETE
1 series · 13 of 25 positions shown · non-contrast
Comparison: Hepatobiliary scan of December 18, 2011 and abdominal
ultrasound December 17, 2011.

CLINICAL DATA: Elevated white blood cell count, positive SAW,
elevated erythropoietin level

EXAM:
ULTRASOUND ABDOMEN COMPLETE

[Series 1: us abdomen complete · 0.23mm/px · 13 of 114 slices shown]
[im 1/114]
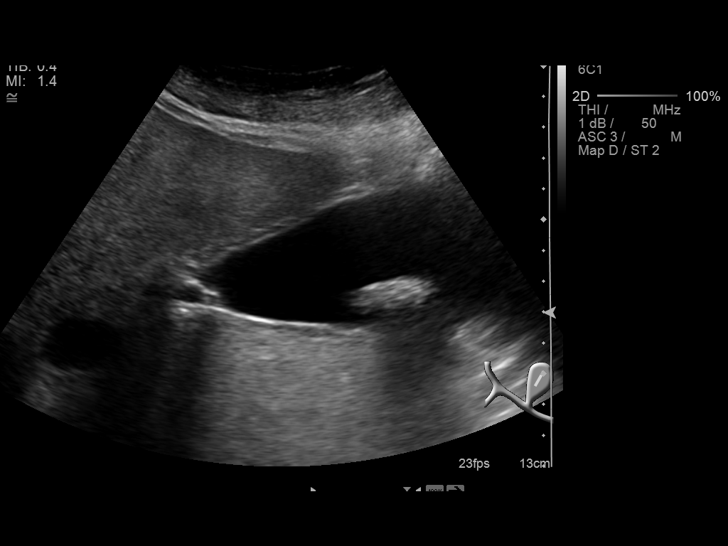
[im 10/114]
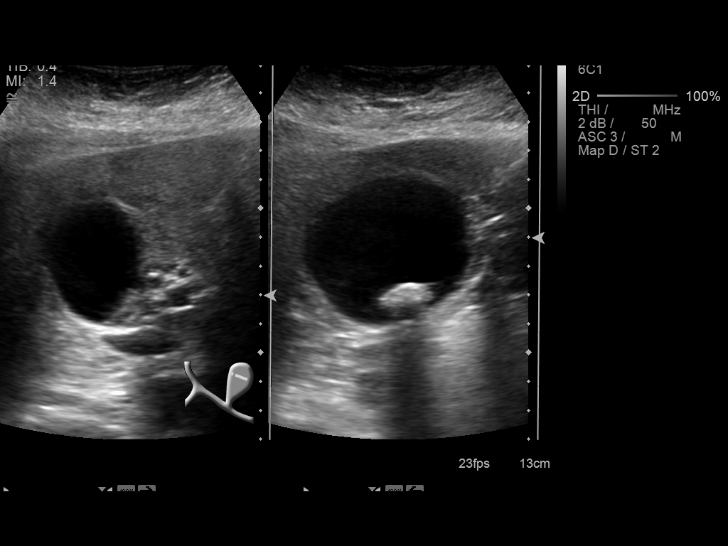
[im 19/114]
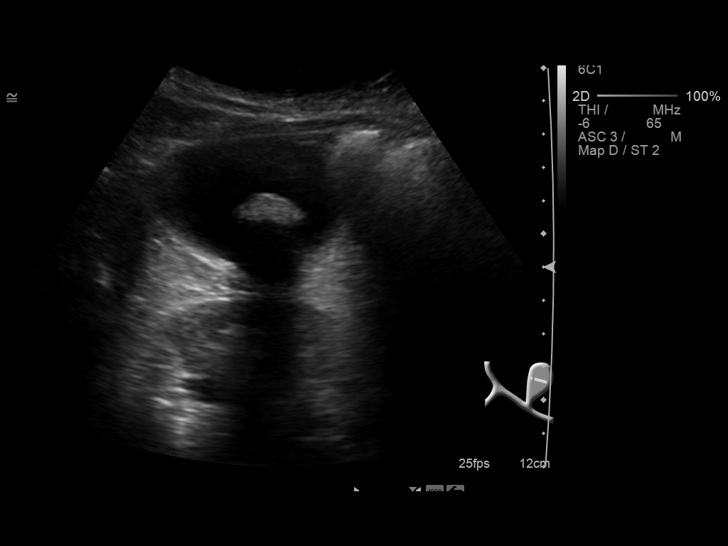
[im 29/114]
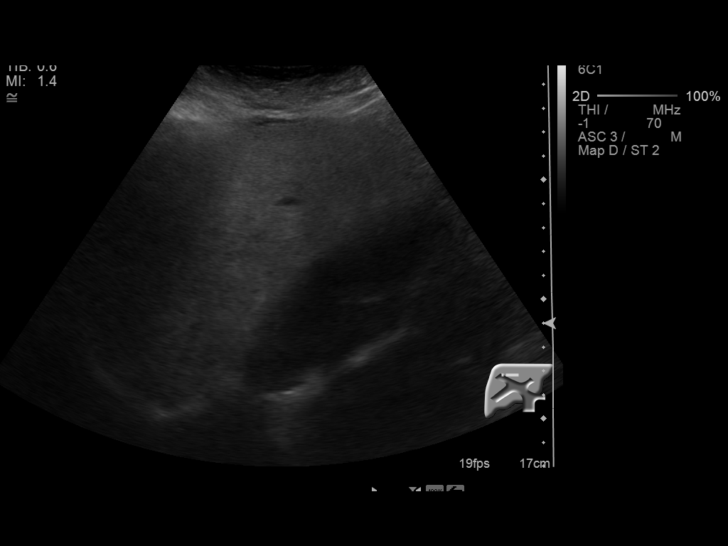
[im 38/114]
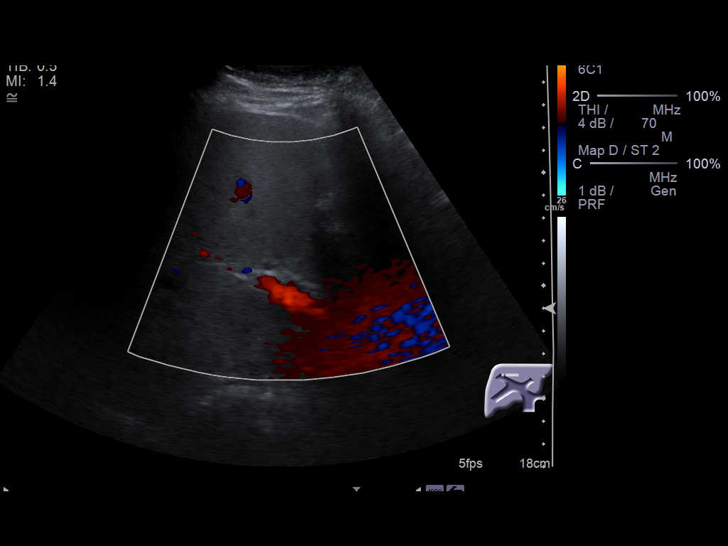
[im 48/114]
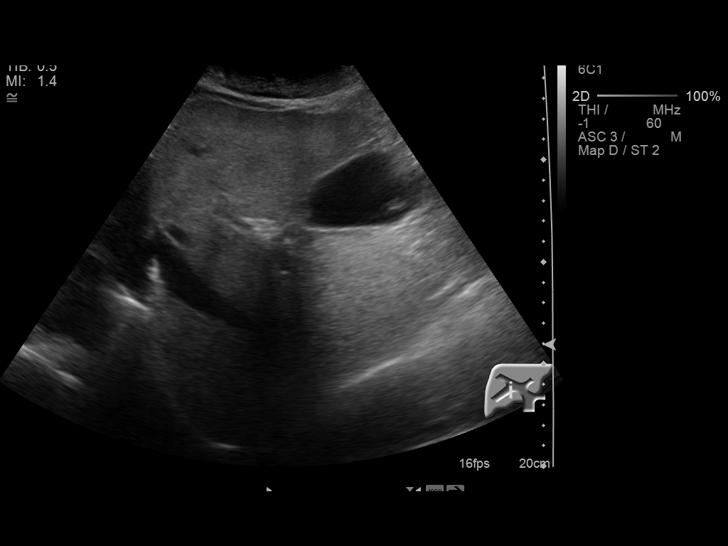
[im 57/114]
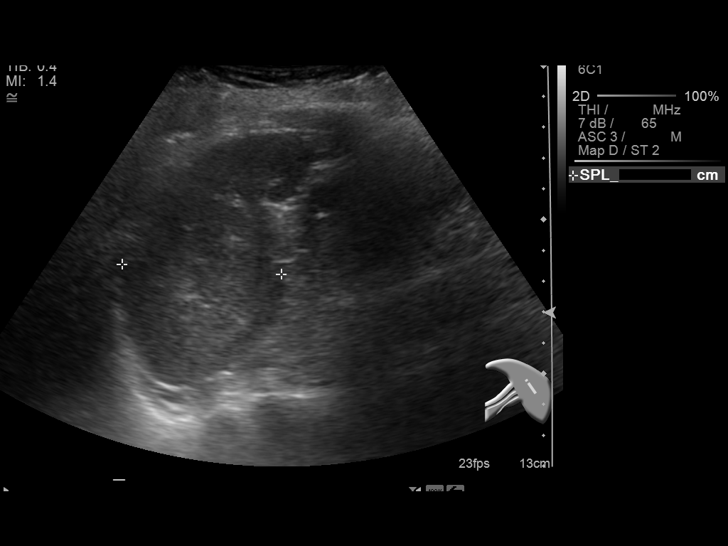
[im 66/114]
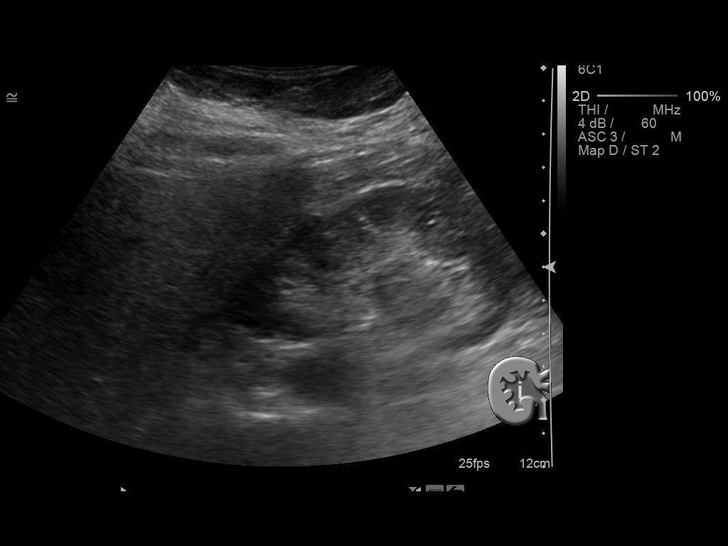
[im 76/114]
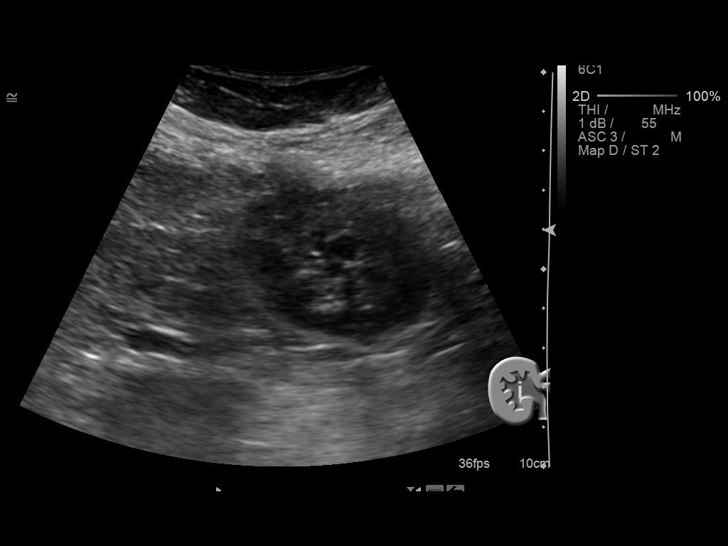
[im 85/114]
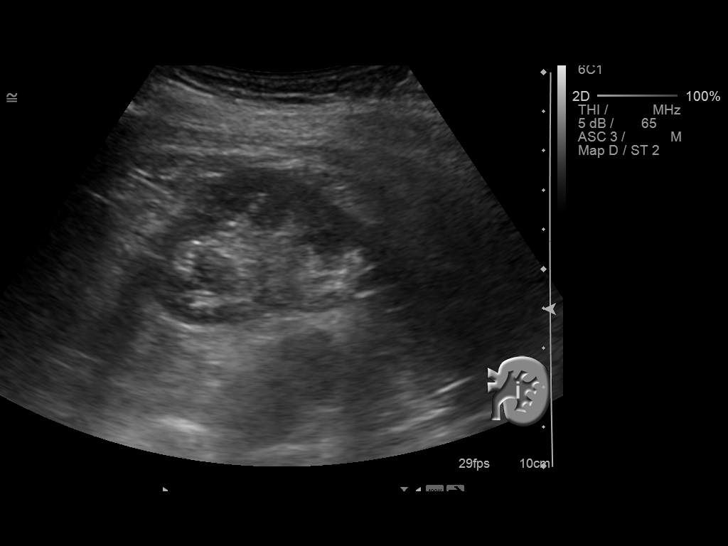
[im 95/114]
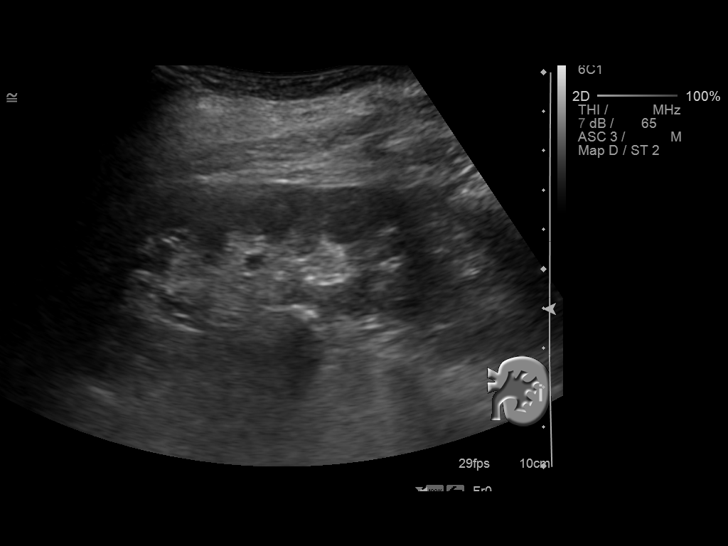
[im 104/114]
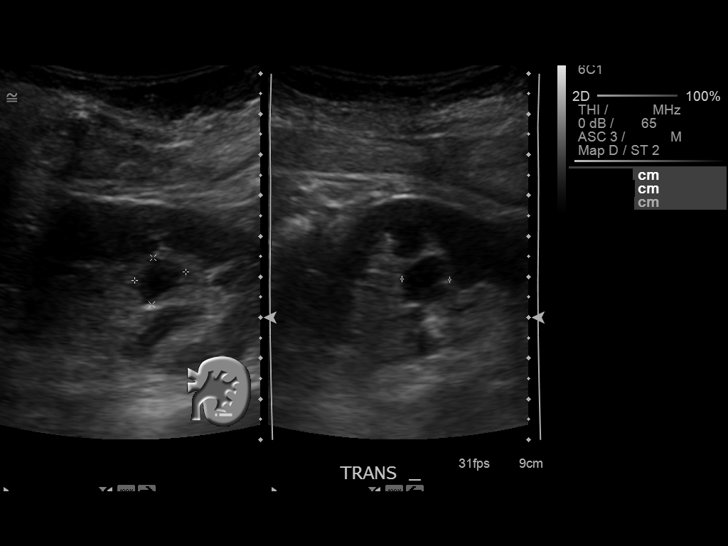
[im 114/114]
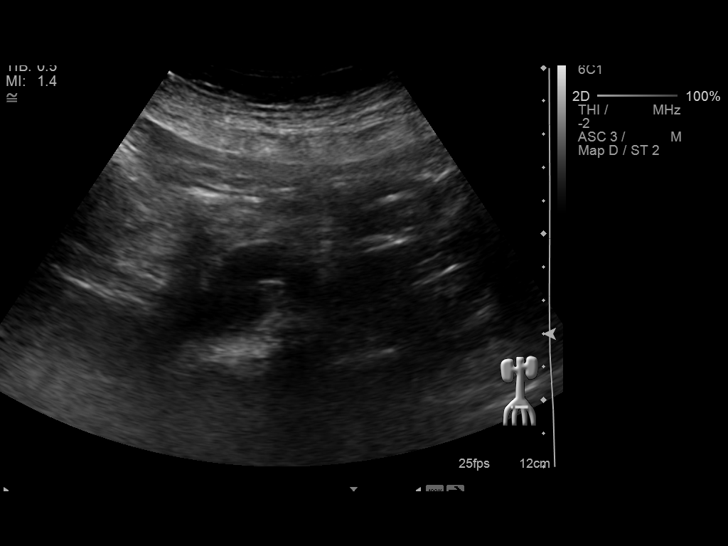

[13 of 25 positions shown; findings below may reference images not displayed]

FINDINGS: Gallbladder: Again demonstrated are gallstones. A large mobile stone
measures 2.3 cm in diameter. The gallbladder is mildly distended.
There is no wall thickening, pericholecystic fluid, or positive
sonographic Murphy's sign.

Common bile duct: Diameter: 5 mm

Liver: The hepatic echotexture is mildly increased. There is no
focal mass nor ductal dilation.

IVC: No abnormality visualized.

Pancreas: Evaluation of the pancreatic head and tail was limited due
to bowel gas.

Spleen: Size and appearance within normal limits.

Right Kidney: Length: 9.4 cm. The renal cortical echotexture remains
lower than that of the adjacent liver. There are parapelvic cysts
with the largest measuring 1.5 cm. There is no hydronephrosis.

Left Kidney: Length: 9.9 cm. The cortical echotexture is normal.
There are parapelvic cysts with the largest measuring 1.3 cm. There
is no hydronephrosis.

Abdominal aorta: No aneurysm visualized. The maximal measured
diameter is 2.9 cm proximally. There is mural plaque.

Other findings: There is no ascites.
IMPRESSION: 1. Mildly distended gallbladder which is a chronic finding. There
are gallstones. There is no sonographic evidence of acute
cholecystitis.
2. Fatty infiltrative change of the liver. Evaluation the pancreas
is limited. The spleen is normal in size.
3. The kidneys are normal in contour. There are parapelvic cysts
bilaterally. There is no evidence of hydronephrosis.

## 2017-08-04 DEATH — deceased

## 2018-06-16 ENCOUNTER — Ambulatory Visit: Payer: Medicare Other

## 2018-06-20 ENCOUNTER — Ambulatory Visit: Payer: Medicare Other
